# Patient Record
Sex: Female | Born: 1980 | Race: Asian | Hispanic: No | Marital: Single | Smoking: Never smoker
Health system: Southern US, Community
[De-identification: ages and names within clinical notes are randomized; demographics above are authoritative.]

## PROBLEM LIST (undated history)

## (undated) DIAGNOSIS — E119 Type 2 diabetes mellitus without complications: Secondary | ICD-10-CM

---
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
## 1999-02-27 ENCOUNTER — Other Ambulatory Visit: Admission: RE | Admit: 1999-02-27 | Discharge: 1999-02-27 | Payer: Self-pay | Admitting: Family Medicine

## 1999-04-18 ENCOUNTER — Ambulatory Visit (HOSPITAL_COMMUNITY): Admission: RE | Admit: 1999-04-18 | Discharge: 1999-04-18 | Payer: Self-pay | Admitting: *Deleted

## 1999-04-20 ENCOUNTER — Ambulatory Visit (HOSPITAL_COMMUNITY): Admission: RE | Admit: 1999-04-20 | Discharge: 1999-04-20 | Payer: Self-pay | Admitting: *Deleted

## 2000-09-19 ENCOUNTER — Emergency Department (HOSPITAL_COMMUNITY): Admission: EM | Admit: 2000-09-19 | Discharge: 2000-09-19 | Payer: Self-pay | Admitting: Emergency Medicine

## 2001-06-22 ENCOUNTER — Other Ambulatory Visit: Admission: RE | Admit: 2001-06-22 | Discharge: 2001-06-22 | Payer: Self-pay | Admitting: Family Medicine

## 2003-01-17 ENCOUNTER — Encounter: Payer: Self-pay | Admitting: *Deleted

## 2003-01-17 ENCOUNTER — Emergency Department (HOSPITAL_COMMUNITY): Admission: EM | Admit: 2003-01-17 | Discharge: 2003-01-17 | Payer: Self-pay | Admitting: *Deleted

## 2003-01-17 ENCOUNTER — Encounter: Payer: Self-pay | Admitting: Emergency Medicine

## 2003-05-20 ENCOUNTER — Other Ambulatory Visit: Admission: RE | Admit: 2003-05-20 | Discharge: 2003-05-20 | Payer: Self-pay | Admitting: Obstetrics and Gynecology

## 2003-12-14 ENCOUNTER — Inpatient Hospital Stay (HOSPITAL_COMMUNITY): Admission: AD | Admit: 2003-12-14 | Discharge: 2003-12-14 | Payer: Self-pay | Admitting: Obstetrics and Gynecology

## 2003-12-19 ENCOUNTER — Inpatient Hospital Stay (HOSPITAL_COMMUNITY): Admission: AD | Admit: 2003-12-19 | Discharge: 2003-12-22 | Payer: Self-pay | Admitting: Obstetrics & Gynecology

## 2004-05-23 ENCOUNTER — Other Ambulatory Visit: Admission: RE | Admit: 2004-05-23 | Discharge: 2004-05-23 | Payer: Self-pay | Admitting: Family Medicine

## 2004-12-26 ENCOUNTER — Encounter: Admission: RE | Admit: 2004-12-26 | Discharge: 2004-12-26 | Payer: Self-pay | Admitting: Family Medicine

## 2005-06-27 ENCOUNTER — Other Ambulatory Visit: Admission: RE | Admit: 2005-06-27 | Discharge: 2005-06-27 | Payer: Self-pay | Admitting: Family Medicine

## 2005-12-30 ENCOUNTER — Ambulatory Visit: Payer: Self-pay | Admitting: Family Medicine

## 2006-03-04 ENCOUNTER — Ambulatory Visit: Payer: Self-pay | Admitting: Family Medicine

## 2006-04-24 IMAGING — CT CT HEAD W/O CM
1 series · 16 of 28 positions shown, 20 images · IV contrast (agent unspecified)
Comparison: none

CLINICAL DATA: Headaches. 
 CT HEAD W/O CONTRAST: 
 Axial scans from the base of the vertex were performed in the unenhanced state.  This scan is compared to a dictation from the CT brain scan performed at [REDACTED] on 01/17/03 showing no abnormality.  On the present study the ventricular system is normal in size and configuration and the septum is midline in position.  No acute intracranial abnormality is seen.  No mass effect is noted.  On bone window images there is some opacification of a few ethmoid air cells.  No acute bony abnormality is seen.

[Series 2: brain · axial · 0.49mm/px · z∈[+11,+137]mm · 16 of 28 slices shown, 20 images]
[im 2/28  brain]
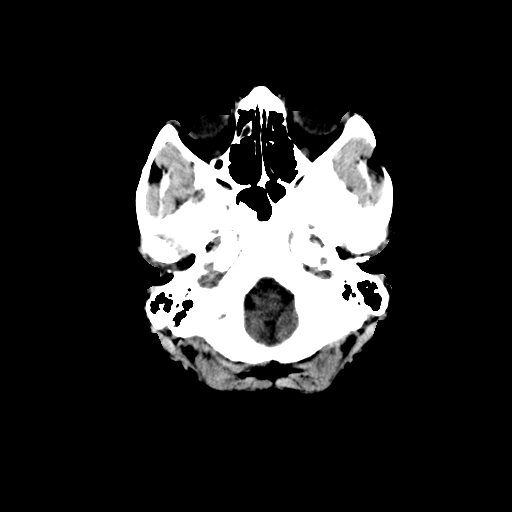
[im 2/28  bone]
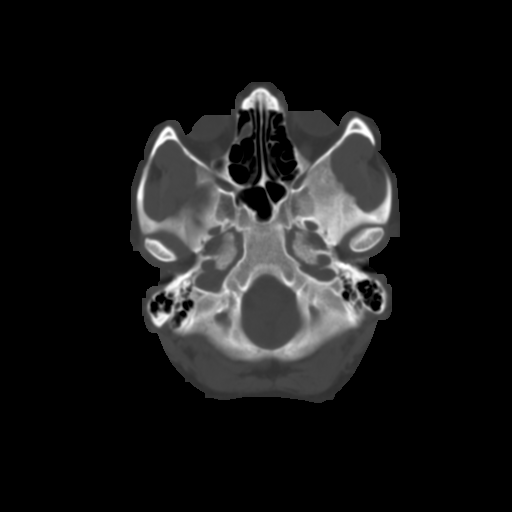
[im 4/28  brain]
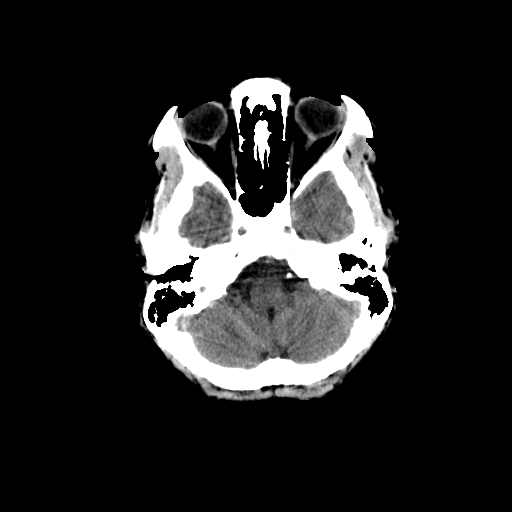
[im 6/28  brain]
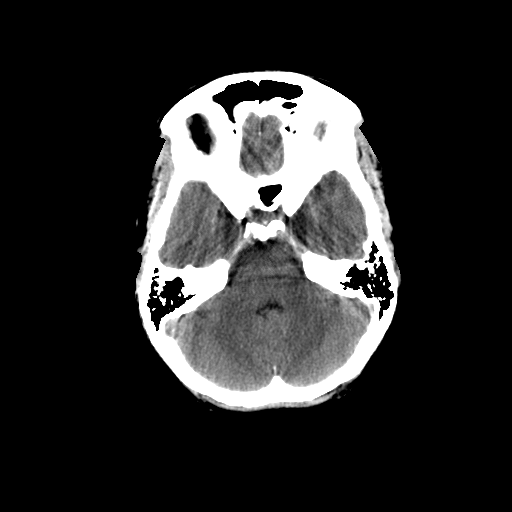
[im 7/28  brain]
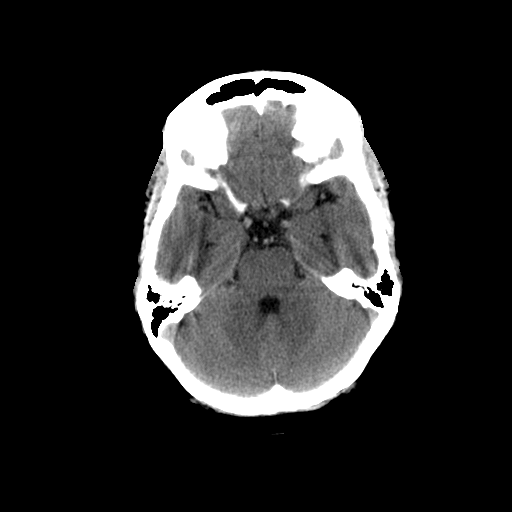
[im 9/28  brain]
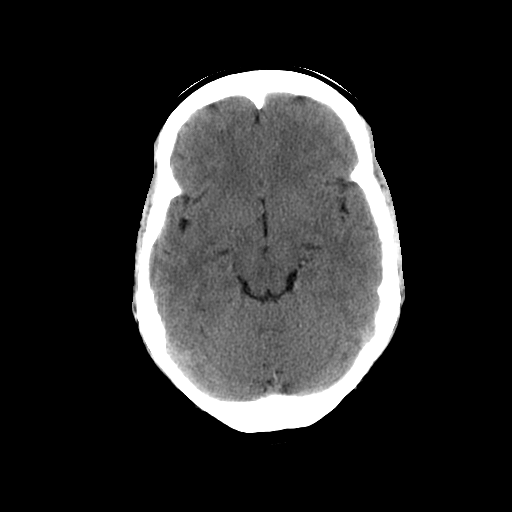
[im 9/28  bone]
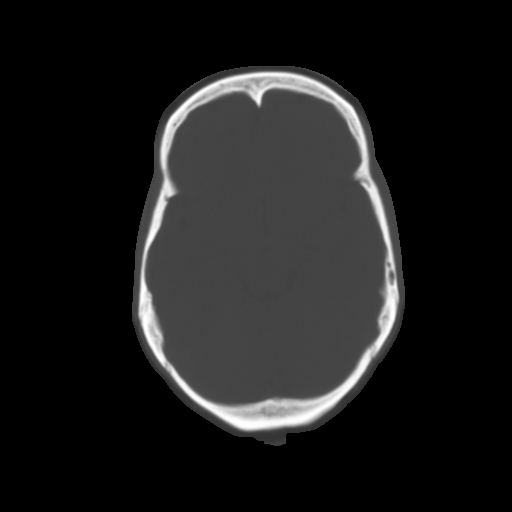
[im 10/28  brain]
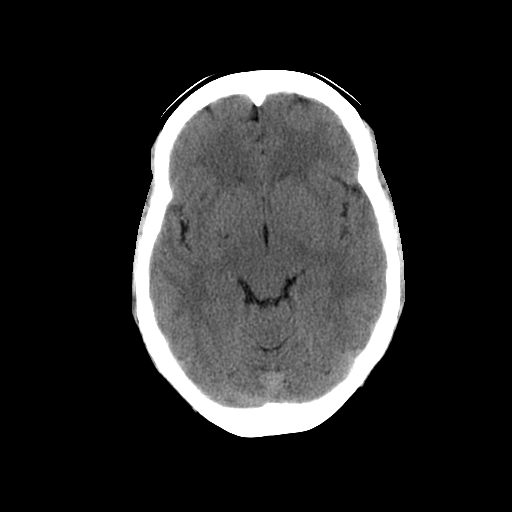
[im 12/28  brain]
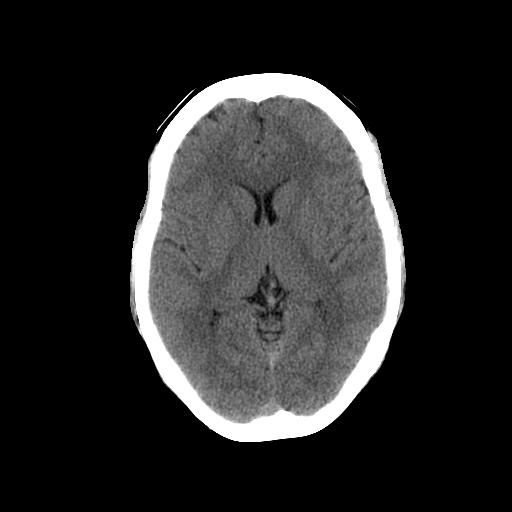
[im 14/28  brain]
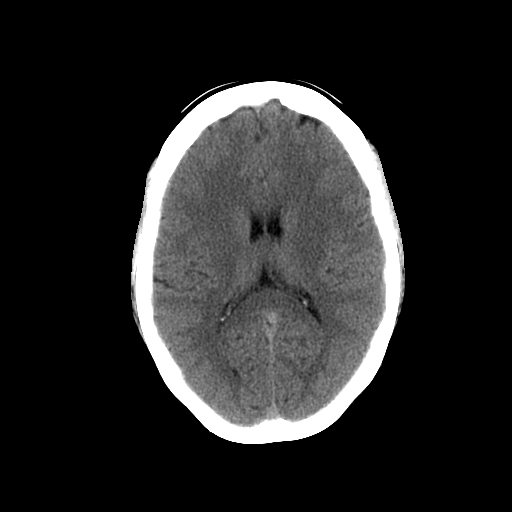
[im 15/28  brain]
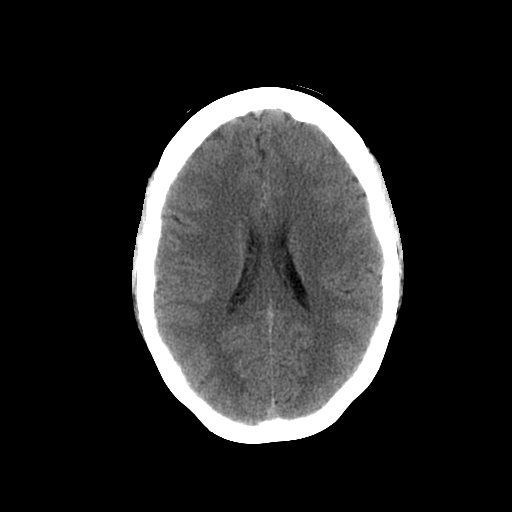
[im 15/28  bone]
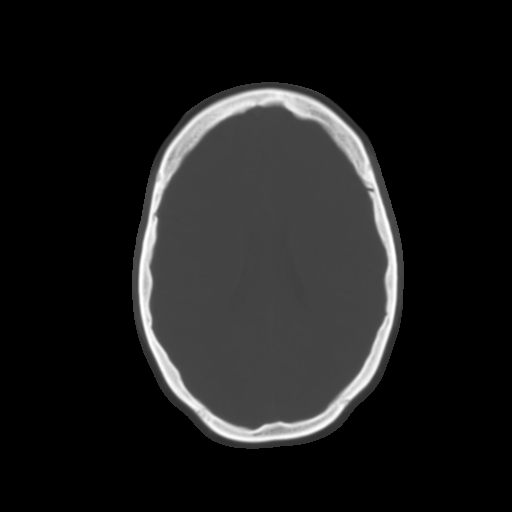
[im 17/28  brain]
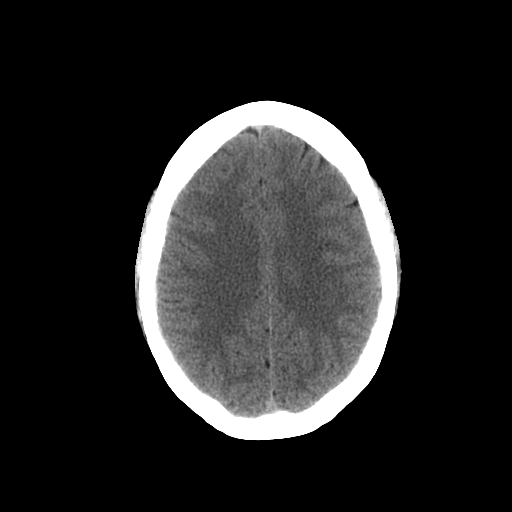
[im 19/28  brain]
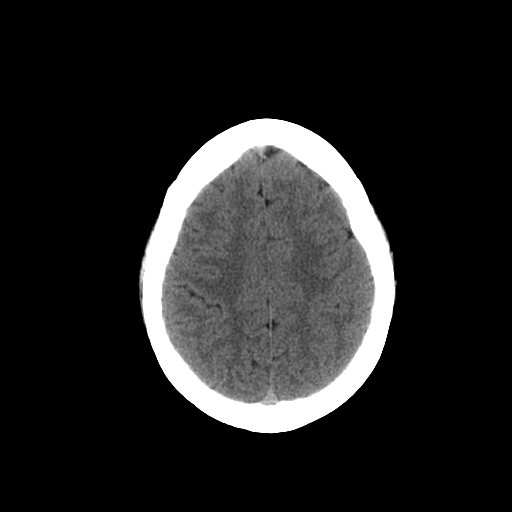
[im 20/28  brain]
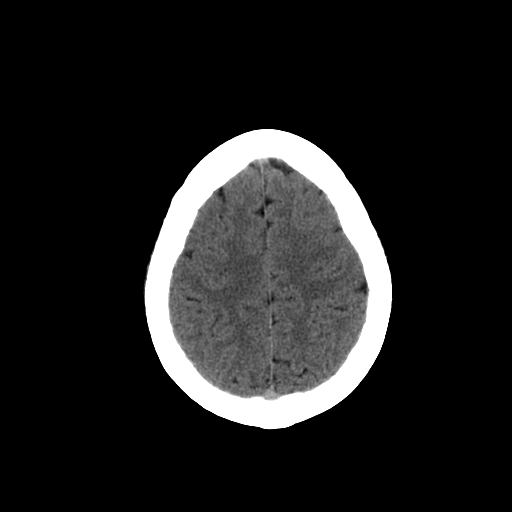
[im 22/28  brain]
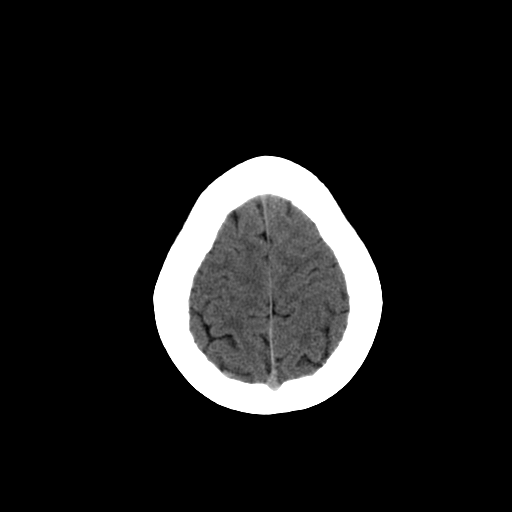
[im 22/28  bone]
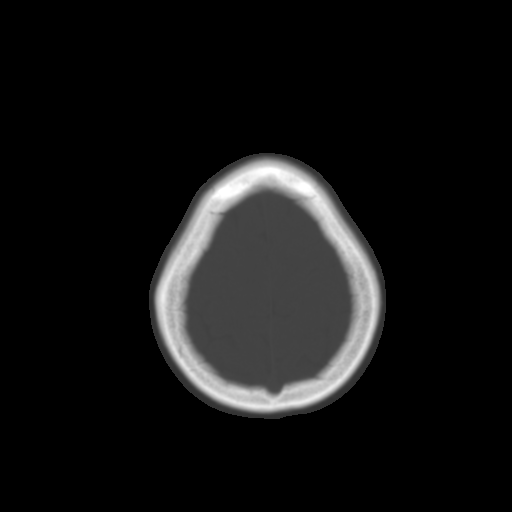
[im 23/28  brain]
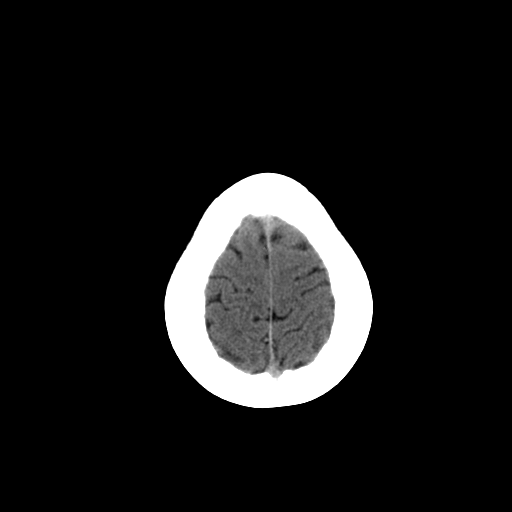
[im 25/28  brain]
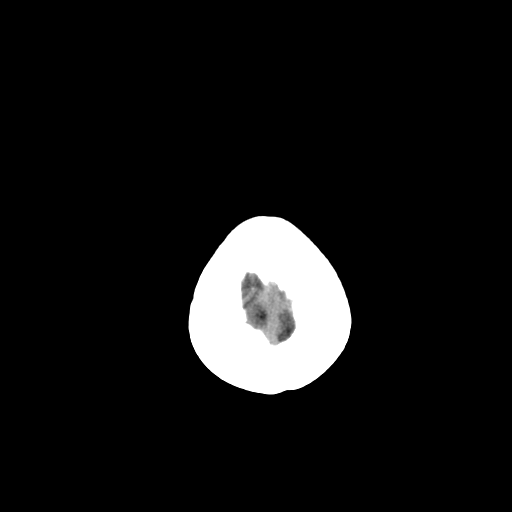
[im 27/28  brain]
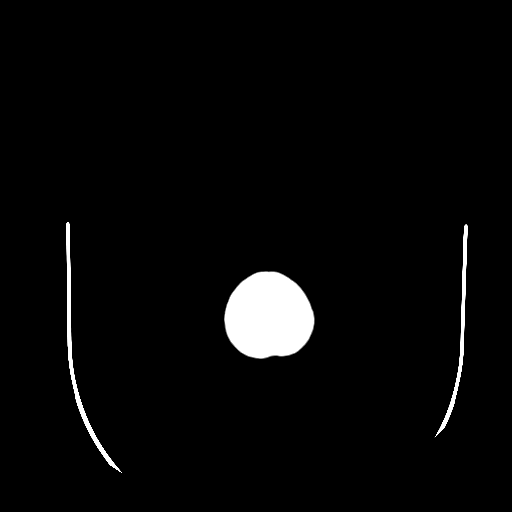

[16 of 28 positions shown; findings below may reference images not displayed]

IMPRESSION: 1.  No acute intracranial abnormality.  
 2.  There is some opacification of a few ethmoid air cells.

## 2006-05-26 ENCOUNTER — Ambulatory Visit: Payer: Self-pay | Admitting: Family Medicine

## 2006-07-04 ENCOUNTER — Ambulatory Visit: Payer: Self-pay | Admitting: Family Medicine

## 2006-08-07 ENCOUNTER — Inpatient Hospital Stay (HOSPITAL_COMMUNITY): Admission: EM | Admit: 2006-08-07 | Discharge: 2006-08-13 | Payer: Self-pay | Admitting: Emergency Medicine

## 2006-08-12 ENCOUNTER — Encounter (INDEPENDENT_AMBULATORY_CARE_PROVIDER_SITE_OTHER): Payer: Self-pay | Admitting: *Deleted

## 2006-08-18 ENCOUNTER — Ambulatory Visit: Payer: Self-pay | Admitting: Family Medicine

## 2006-08-29 ENCOUNTER — Ambulatory Visit: Payer: Self-pay | Admitting: Family Medicine

## 2006-09-05 ENCOUNTER — Ambulatory Visit: Payer: Self-pay | Admitting: Family Medicine

## 2006-09-17 ENCOUNTER — Ambulatory Visit: Payer: Self-pay | Admitting: Family Medicine

## 2006-09-19 ENCOUNTER — Ambulatory Visit (HOSPITAL_COMMUNITY): Admission: RE | Admit: 2006-09-19 | Discharge: 2006-09-19 | Payer: Self-pay | Admitting: Family Medicine

## 2006-10-15 ENCOUNTER — Ambulatory Visit: Payer: Self-pay | Admitting: Family Medicine

## 2006-11-14 ENCOUNTER — Other Ambulatory Visit: Admission: RE | Admit: 2006-11-14 | Discharge: 2006-11-14 | Payer: Self-pay | Admitting: Family Medicine

## 2007-02-25 ENCOUNTER — Emergency Department (HOSPITAL_COMMUNITY): Admission: EM | Admit: 2007-02-25 | Discharge: 2007-02-25 | Payer: Self-pay | Admitting: Emergency Medicine

## 2007-12-09 IMAGING — CR DG CHEST 1V PORT
1 series · 1 of 1 positions shown · non-contrast
Comparison: none

CLINICAL DATA: Pneumonia.  Status-post lung biopsy. Known left upper lobe mass. 
 PORTABLE CHEST - 1 VIEW ? 08/12/06 AT 7475 HOURS:

[view not recorded]
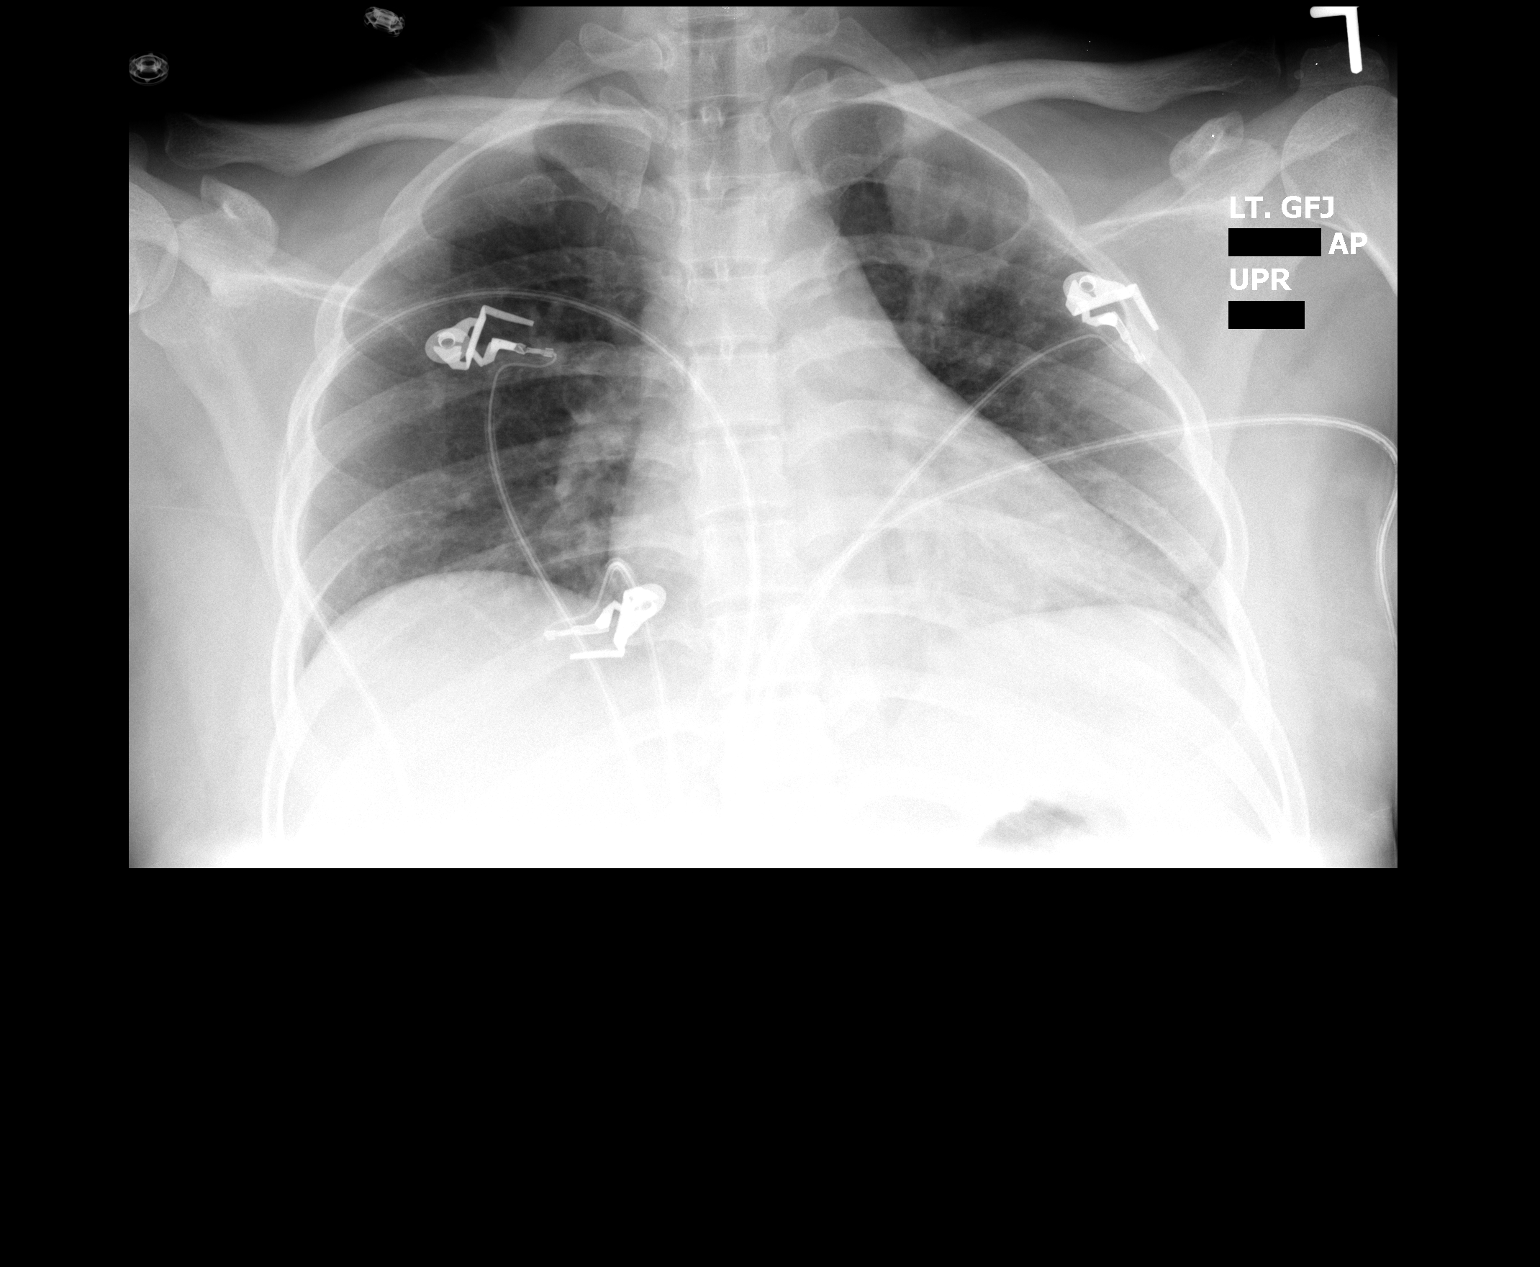

[1 of 1 positions shown; findings below may reference images not displayed]

FINDINGS: Left upper lobe cavitary mass is appreciated. Specifically, no evidence of a pneumothorax post-reported biopsy.
IMPRESSION: Left upper lobe mass. No pneumothorax or other acute chest findings.

## 2008-01-16 IMAGING — CR DG CHEST 2V
2 series · 2 of 2 positions shown · non-contrast
Comparison: none

HISTORY: Pneumonia, cough, followup

CHEST 2 VIEWS:
Comparison 08/12/2006
Upper normal heart size.
Normal mediastinal contours and pulmonary vascularity.
Decreased density in left upper lobe since previous exam question improving
infiltrate.
Remaining lungs clear.
No pleural effusion or pneumothorax.

[view not recorded (1 of 2)]
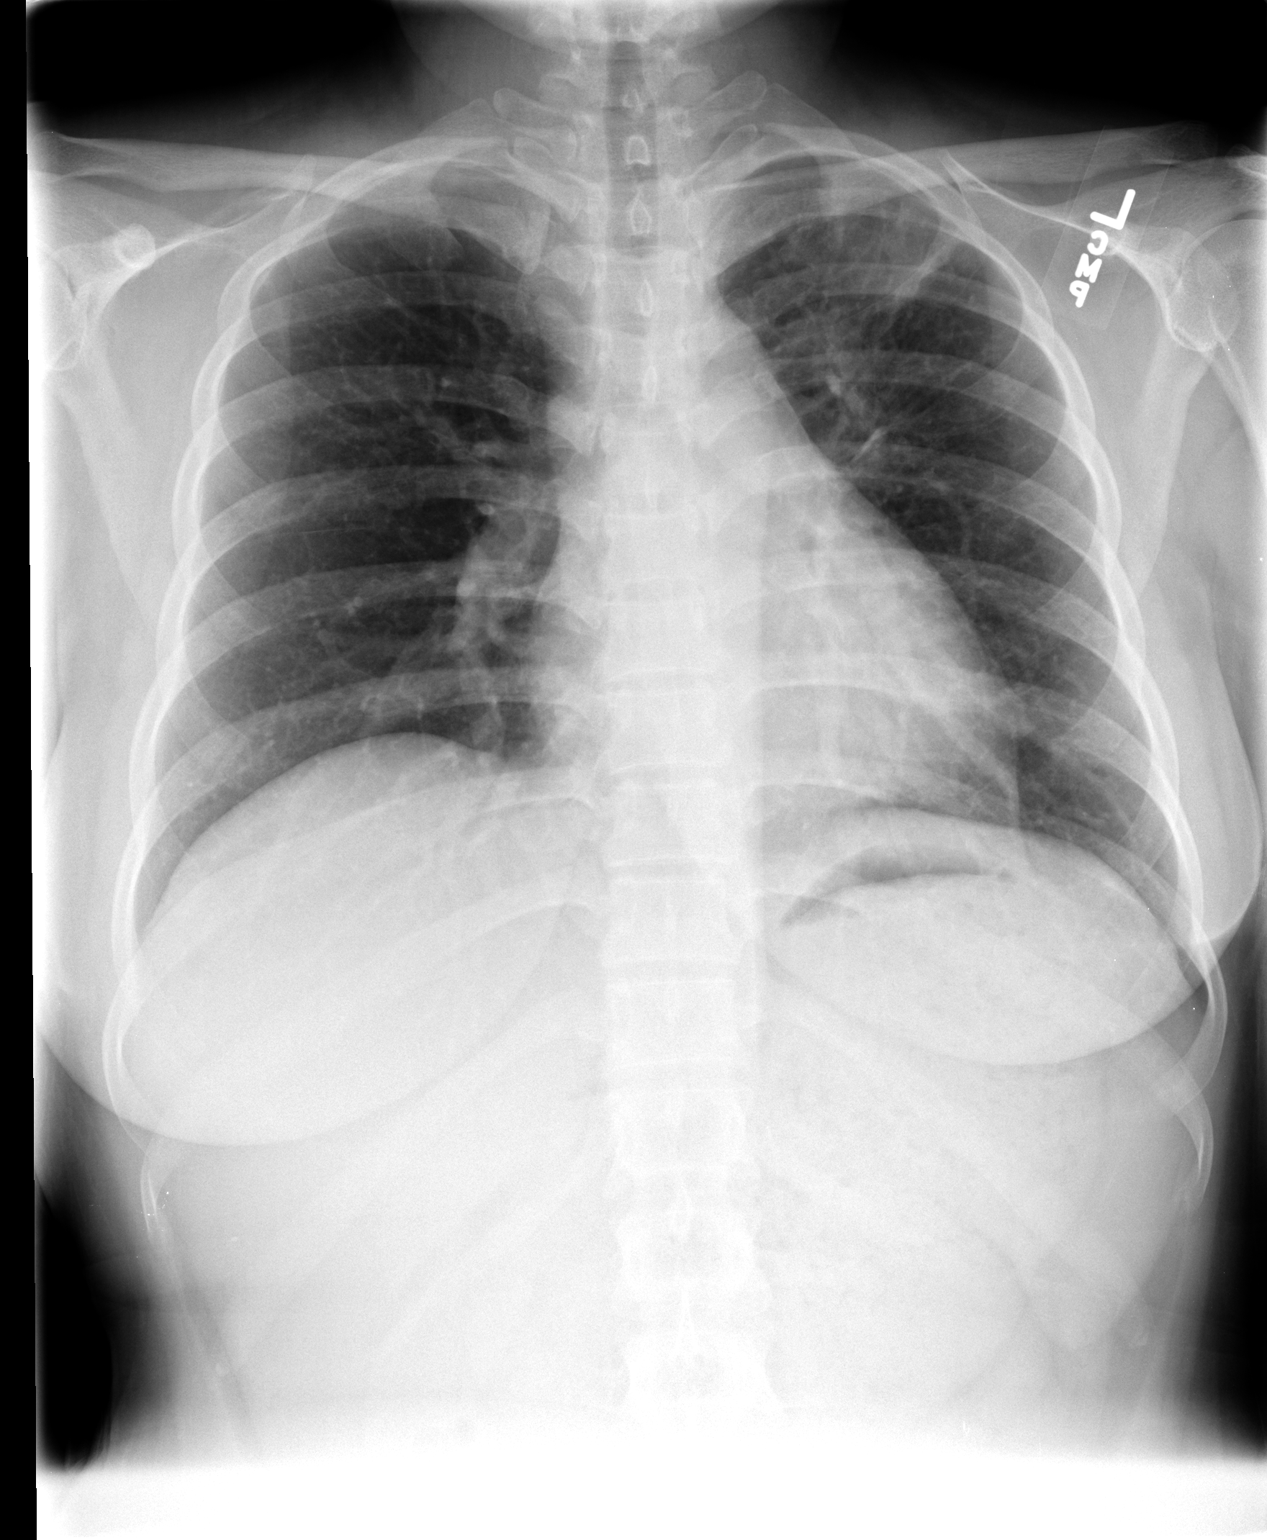

[view not recorded (2 of 2)]
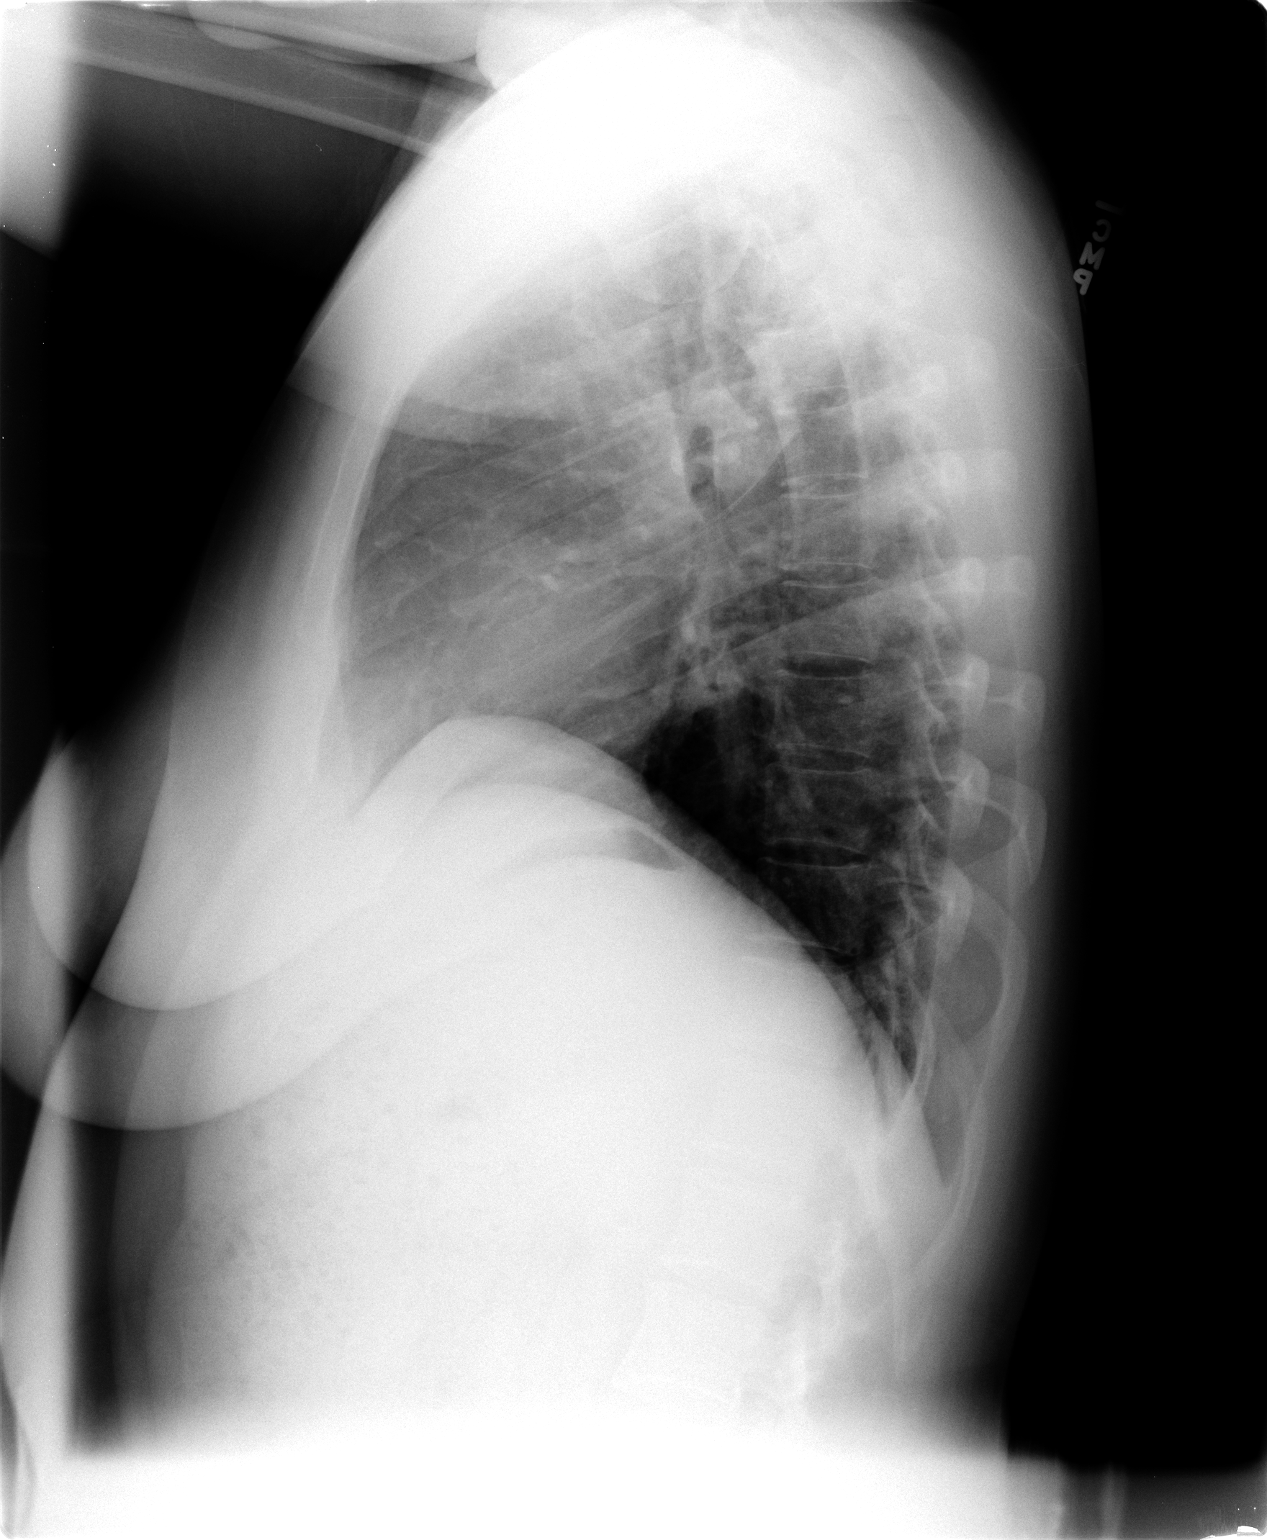

[2 of 2 positions shown; findings below may reference images not displayed]

IMPRESSION: Improving density in left upper lobe suggesting that the area of masslike
opacity seen on prior CT chest was infiltrate rather than tumor.
Continued followup until complete resolution recommended to exclude underlying
pulmonary mass.
Remainder of exam unremarkable and unchanged.

## 2008-06-21 ENCOUNTER — Other Ambulatory Visit: Admission: RE | Admit: 2008-06-21 | Discharge: 2008-06-21 | Payer: Self-pay | Admitting: Family Medicine

## 2008-12-28 ENCOUNTER — Encounter (INDEPENDENT_AMBULATORY_CARE_PROVIDER_SITE_OTHER): Payer: Self-pay | Admitting: Family Medicine

## 2009-01-05 ENCOUNTER — Emergency Department (HOSPITAL_COMMUNITY): Admission: EM | Admit: 2009-01-05 | Discharge: 2009-01-05 | Payer: Self-pay | Admitting: Emergency Medicine

## 2009-12-15 LAB — CONVERTED CEMR LAB

## 2010-07-12 ENCOUNTER — Ambulatory Visit: Payer: Self-pay | Admitting: Nurse Practitioner

## 2010-07-12 DIAGNOSIS — R5383 Other fatigue: Secondary | ICD-10-CM

## 2010-07-12 DIAGNOSIS — E119 Type 2 diabetes mellitus without complications: Secondary | ICD-10-CM | POA: Insufficient documentation

## 2010-07-12 DIAGNOSIS — R5381 Other malaise: Secondary | ICD-10-CM | POA: Insufficient documentation

## 2010-07-13 ENCOUNTER — Ambulatory Visit: Payer: Self-pay | Admitting: Nurse Practitioner

## 2010-07-13 DIAGNOSIS — Z862 Personal history of diseases of the blood and blood-forming organs and certain disorders involving the immune mechanism: Secondary | ICD-10-CM | POA: Insufficient documentation

## 2010-07-13 DIAGNOSIS — Z8639 Personal history of other endocrine, nutritional and metabolic disease: Secondary | ICD-10-CM | POA: Insufficient documentation

## 2010-07-13 LAB — CONVERTED CEMR LAB
BUN: 12 mg/dL (ref 6–23)
CO2: 27 meq/L (ref 19–32)
Creatinine, Ser: 0.55 mg/dL (ref 0.40–1.20)
Eosinophils Relative: 3 % (ref 0–5)
Glucose, Bld: 243 mg/dL — ABNORMAL HIGH (ref 70–99)
HCT: 41.4 % (ref 36.0–46.0)
Hemoglobin: 14.1 g/dL (ref 12.0–15.0)
Hgb A1c MFr Bld: 9.3 % — ABNORMAL HIGH (ref <5.7)
Lymphocytes Relative: 44 % (ref 12–46)
Lymphs Abs: 5.3 10*3/uL — ABNORMAL HIGH (ref 0.7–4.0)
Monocytes Absolute: 0.5 10*3/uL (ref 0.1–1.0)
Preg, Serum: NEGATIVE
Total Bilirubin: 0.4 mg/dL (ref 0.3–1.2)
WBC: 12.2 10*3/uL — ABNORMAL HIGH (ref 4.0–10.5)

## 2010-07-16 ENCOUNTER — Telehealth (INDEPENDENT_AMBULATORY_CARE_PROVIDER_SITE_OTHER): Payer: Self-pay | Admitting: Nurse Practitioner

## 2010-07-30 ENCOUNTER — Encounter (INDEPENDENT_AMBULATORY_CARE_PROVIDER_SITE_OTHER): Payer: Self-pay | Admitting: Nurse Practitioner

## 2010-08-23 ENCOUNTER — Ambulatory Visit: Payer: Self-pay | Admitting: Nurse Practitioner

## 2010-10-04 ENCOUNTER — Ambulatory Visit
Admission: RE | Admit: 2010-10-04 | Discharge: 2010-10-04 | Payer: Self-pay | Source: Home / Self Care | Attending: Nurse Practitioner | Admitting: Nurse Practitioner

## 2010-10-14 LAB — CONVERTED CEMR LAB
Bilirubin Urine: NEGATIVE
Hep A IgM: NEGATIVE
Hep B C IgM: NEGATIVE
Ketones, urine, test strip: NEGATIVE
Microalb, Ur: 28.36 mg/dL — ABNORMAL HIGH (ref 0.00–1.89)
Nitrite: NEGATIVE
Specific Gravity, Urine: >=1.03

## 2010-10-16 NOTE — Progress Notes (Signed)
Summary: Lab results  Phone Note Outgoing Call   Summary of Call: notify pt that her liver enzymes are elevated she is diabetic so she needs to be sure that her blood sugar is under good control Does she drink any alcohol?  If so she needs to decrease her use as it is making her liver work too hard she also may have a fatty liver which is worse by fried and fatty foods. She needs lab visit - lft's in 6-8 weeks if still elevated then may need additional testing such as u/s Initial call taken by: Lehman Prom FNP,  July 16, 2010 3:06 PM  Follow-up for Phone Call        [phone] no answer, no voice mail; [phone] due to network difficulties call can not be completed. Gaylyn Cheers RN  July 25, 2010 3:17 PM  pt informed and will come for a visit on 08/23/10 with provider.  Follow-up by: Levon Hedger,  July 27, 2010 9:04 AM

## 2010-10-16 NOTE — Assessment & Plan Note (Signed)
Summary: Re-establish Care   Vital Signs:  Patient profile:   30 year old female LMP:     06/2010 Height:      61 inches Weight:      144.5 pounds BMI:     27.40 BSA:     1.65 Temp:     97.8 degrees F oral Pulse rate:   72 / minute Pulse rhythm:   regular Resp:     16 per minute BP sitting:   122 / 86  (left arm) Cuff size:   regular  Vitals Entered By: Levon Hedger (July 12, 2010 4:14 PM)  Nutrition Counseling: Patient's BMI is greater than 25 and therefore counseled on weight management options. CC:  re-establish...DM..Marland Kitchenwants to get depo shot Is Patient Diabetic? Yes Pain Assessment Patient in pain? no      CBG Result 285 CBG Device ID B  Does patient need assistance? Functional Status Self care Ambulation Normal Comments pt has had her menses 10/1 thur 10/27 LMP (date): 06/2010     Enter LMP: 06/2010 Last PAP Result done in another office   CC:   re-establish...DM..Marland Kitchenwants to get depo shot.  History of Present Illness:  Pt into the office for family planning. Pt was previously on depoprovera and tolerated well however due to finances she had to stop taking the medications.  she transitioned to [facility] Pt missed her last depoprovera injection due tin July. Her menses restarted in October 1st and pt states that she is still flowing.  Pt is not married but lives her boyfriend. She has 2 children - ages 58 and 6  Pap - done April of this year by another provider. Pt reports that it was ok.  Diabetes Management History:      The patient is a 30 years old female who comes in for evaluation of DM Type 2.  She has not been enrolled in the "Diabetic Education Program".  She states lack of understanding of dietary principles and is not following her diet appropriately.  No sensory loss is reported.  Self foot exams are not being performed.  She is not checking home blood sugars.  She says that she is exercising.        Hypoglycemic symptoms are  not occurring.  No hyperglycemic symptoms are reported.  Other comments include: pt is taking diabetes medication but she does not know the name of the medication.    Habits & Providers  Alcohol-Tobacco-Diet     Alcohol drinks/day: 0     Tobacco Status: never  Exercise-Depression-Behavior     Does Patient Exercise: yes     Exercise Counseling: to improve exercise regimen     Drug Use: never  Allergies (verified): No Known Drug Allergies  Social History: Smoking Status:  never Drug Use:  never Does Patient Exercise:  yes  Review of Systems General:  Complains of fatigue. CV:  Denies chest pain or discomfort. Resp:  Denies cough. GI:  Denies abdominal pain, nausea, and vomiting.  Physical Exam  General:  alert.   Head:  normocephalic.   Lungs:  normal breath sounds.   Heart:  normal rate and regular rhythm.   Abdomen:  normal bowel sounds.   Neurologic:  alert & oriented X3.   Psych:  Oriented X3.    Diabetes Management Exam:    Foot Exam (with socks and/or shoes not present):       Sensory-Monofilament:          Left foot: normal  Right foot: normal       Nails:          Left foot: normal          Right foot: normal   Impression & Recommendations:  Problem # 1:  FAMILY PLANNING (ICD-V25.09) will check labs today pt to return tomorrow for infection (assuming lab is negative) PAP done per pt at previous office need to get records from previous provider Orders: T-Pregnancy (Serum), Qual.  (98119-14782)  Problem # 2:  FATIGUE (ICD-780.79) will check cbc today may be due to menses Orders: T-CBC w/Diff (95621-30865)  Problem # 3:  NEED PROPHYLACTIC VACCINATION&INOCULATION FLU (ICD-V04.81) given today in office  Problem # 4:  DIABETES MELLITUS, TYPE II (ICD-250.00)  Orders: Capillary Blood Glucose/CBG (78469) T-Comprehensive Metabolic Panel (62952-84132) T- Hemoglobin A1C (44010-27253)  Complete Medication List: 1)  Depo-provera 150 Mg/ml Susp  (Medroxyprogesterone acetate) .... One injection im x 1 dose for birth control  Other Orders: Flu Vaccine 67yrs + (66440) Admin 1st Vaccine (34742)  Diabetes Management Assessment/Plan:      Her blood pressure goal is < 130/80.    Patient Instructions: 1)  Diabetes - your blood sugar is high today.  Labs will be done to check how well controlled your diabetes is. 2)  Bring all your medications into this office tomorrow. 3)  Go get depoprovera medication from the pharmacy 4)  Bring back to this office for infection 5)  schedule a nurse visit tomorrow - check lab before given injection 6)  review other labs done today. 7)  if negative then ok to give depoprovera 8)  You have received the flu vaccine today 9)  Schedule an appointment with n.martin,fnp for diabetes Prescriptions: DEPO-PROVERA 150 MG/ML SUSP (MEDROXYPROGESTERONE ACETATE) One injection IM x 1 dose for birth control  #1 x 3   Entered and Authorized by:   Lehman Prom FNP   Signed by:   Lehman Prom FNP on 07/12/2010   Method used:   Print then Give to Patient   RxID:   [id number]    Orders Added: 1)  Flu Vaccine 77yrs + [88416] 2)  Admin 1st Vaccine [90471] 3)  Est. Patient Level III [60630] 4)  T-Pregnancy (Serum), Qual.  [84703-23895] 5)  T-CBC w/Diff [16010-93235] 6)  Capillary Blood Glucose/CBG [82948] 7)  T-Comprehensive Metabolic Panel [80053-22900] 8)  T- Hemoglobin A1C [83036-23375]   Immunizations Administered:  Influenza Vaccine # 1:    Vaccine Type: Fluvax 3+    Site: left deltoid    Mfr: [email or web address]    Dose: 0.5 ml    Route: IM    Given by: Levon Hedger    Exp. Date: 03/16/2011    Lot #: [id number]    VIS given: 04/10/10 version given July 12, 2010.  Flu Vaccine Consent Questions:    Do you have a history of severe allergic reactions to this vaccine? no    Any prior history of allergic reactions to egg and/or gelatin? no    Do you have a sensitivity to the preservative  Thimersol? no    Do you have a past history of Guillan-Barre Syndrome? no    Do you currently have an acute febrile illness? no    Have you ever had a severe reaction to latex? no    Vaccine information given and explained to patient? yes    Are you currently pregnant? no    ndc  [phone]  Immunizations Administered:  Influenza Vaccine # 1:    Vaccine Type:  Fluvax 3+    Site: left deltoid    Mfr: [email or web address]    Dose: 0.5 ml    Route: IM    Given by: Levon Hedger    Exp. Date: 03/16/2011    Lot #: [id number]    VIS given: 04/10/10 version given July 12, 2010.           Diabetic Foot Exam    10-g (5.07) Semmes-Weinstein Monofilament Test Performed by: Levon Hedger          Right Foot          Left Foot Visual Inspection               Test Control      normal         normal Site 1         normal         normal Site 2         normal         normal Site 3         normal         normal Site 4         normal         normal Site 5         normal         normal Site 6         normal         normal Site 7         normal         normal Site 8         normal         normal Site 9         normal         normal Site 10         normal         normal  Impression      normal         normal

## 2010-10-16 NOTE — Assessment & Plan Note (Addendum)
Summary: Depoprovera  Nurse Visit Jan. 19 is next depo.... [name]  July 13, 2010 9:07 AM   Allergies: No Known Drug Allergies Laboratory Results   Urine Tests    Routine Urinalysis   Glucose: negative   (Normal Range: Negative) Bilirubin: negative   (Normal Range: Negative) Ketone: negative   (Normal Range: Negative) Spec. Gravity: >=1.030   (Normal Range: 1.003-1.035) Blood: negative   (Normal Range: Negative) pH: 5.5   (Normal Range: 5.0-8.0) Protein: 100   (Normal Range: Negative) Urobilinogen: 0.2   (Normal Range: 0-1) Nitrite: negative   (Normal Range: Negative) Leukocyte Esterace: negative   (Normal Range: Negative)       Medication Administration  Injection # 1:    Medication: Depo-Provera 150mg     Diagnosis: FAMILY PLANNING (ICD-V25.09)    Route: IM    Site: R deltoid    Exp Date: 11/2012    Lot #: [id number]    Mfr: [email or web address]    Comments: ndc    Patient tolerated injection without complications    Given by: [name] (July 13, 2010 9:02 AM)  Orders Added: 1)  Est. Patient Level I [03474] 2)  T-Urine Microalbumin w/creat. ratio [82043-[address]] 3)  Depo-Provera 150mg  [J1055]  Patient Instructions: 1)  Schedule appointment with n.martin,fnp  in 4 weeks for diabetes. 2)  Will need foot check, u/a, cbg, lipids. 3)  Come fasting for this appointment 4)  Bring all your medications into the office during this visit 5)  Next depoprovera will be due on January 19th 6)  Bring medication (injection) into the office on this day. 7)  Schedule as office visit with n.martin,fnp to also discuss diabetes   Medication Administration  Injection # 1:    Medication: Depo-Provera 150mg     Diagnosis: FAMILY PLANNING (ICD-V25.09)    Route: IM    Site: R deltoid    Exp Date: 11/2012    Lot #: [id number]    Mfr: [email or web address]    Comments: ndc    Patient tolerated injection without complications    Given by: [name] (July 13, 2010 9:02  AM)  Orders Added: 1)  Est. Patient Level I [87564] 2)  T-Urine Microalbumin w/creat. ratio [82043-[address]] 3)  Depo-Provera 150mg  [J1055]

## 2010-10-25 ENCOUNTER — Encounter (INDEPENDENT_AMBULATORY_CARE_PROVIDER_SITE_OTHER): Payer: Self-pay | Admitting: Nurse Practitioner

## 2010-10-25 DIAGNOSIS — R7611 Nonspecific reaction to tuberculin skin test without active tuberculosis: Secondary | ICD-10-CM | POA: Insufficient documentation

## 2010-11-01 NOTE — Letter (Signed)
Summary: RECEIVED RECORDS FROM [facility]  RECEIVED RECORDS FROM [facility]   Imported By: Arta Bruce 10/25/2010 10:15:24  _____________________________________________________________________  External Attachment:    Type:   Image     Comment:   External Document

## 2010-11-01 NOTE — Miscellaneous (Signed)
Summary: Hx update  Clinical Lists Changes  Problems: Added new problem of NONSPEC REACT TUBERCULIN SKIN TEST W/O ACTIVE TB (ICD-795.51) - Tx INH

## 2011-02-01 NOTE — H&P (Signed)
[name], [name]                              ACCOUNT NO.:  [id number]   MEDICAL RECORD NO.:  [id number]                   PATIENT TYPE:  MAT   LOCATION:  [facility]                                 FACILITY:  [facility]   PHYSICIAN:  Janine Limbo, M.D.            DATE OF BIRTH:  [birth year]   DATE OF ADMISSION:  12/19/2003  DATE OF DISCHARGE:                                HISTORY & PHYSICAL   [name] is a 30 year old gravida 2, para 1-0-0-1, at 40-5/7 weeks, who  presents with a small amount of leaking and a small amount of spotting since  approximately 7:30 a.m.  She reports mild irregular uterine contractions.  She does report positive fetal movement and denies any other problems.  Pregnancy has been remarkable for:   1. Language barrier with patient being [place] although she does speak     some [place] and understand [place].  2. 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    PRENATAL LABORATORY DATA:  Blood type is A positive.  Rh antibody negative.  VDRL nonreactive.  Rubella titer positive.  Hepatitis B surface antigen  negative.  HIV nonreactive. GC and Chlamydia cultures were negative.  Pap  was normal.  Glucose challenge was normal.  Hemoglobin electrophoresis was  normal.  Hemoglobin upon entering the practice was 12.3.  It was 12 at 25  weeks.  Group B strep culture was negative at 36 weeks.  Other cultures were  also negative at 36 weeks.  EDC of December 15, 2003, was established by last  menstrual period and was in agreement with ultrasound at approximately 18  weeks.   HISTORY OF PRESENT PREGNANCY:  The patient entered care at approximately 10  weeks.  She had urine culture secondary to proteinuria.  Urine culture was  negative.  She had a hemoglobin electrophoresis and HIV with __________  labs.  These were all negative.  She had an ultrasound at 18 weeks that  showed normal growth and development.  Her AFP was normal.  She did have  some sciatica beginning at 22 weeks.   She also began to have some  varicosities on the right upper thigh at approximately 26 weeks.  Her  Glucola was normal.  She continued to have those issues with varicosities in  the upper thigh and lower leg.  She stopped work at approximately 34-35  weeks secondary to the varicosities since she worked in a very cold  environment.  The rest of her pregnancy was essentially uncomplicated.  She  was 1 cm at her last exam.   PAST OBSTETRICAL HISTORY:  In 1999 she had a vaginal birth of a female infant,  weight 7 pounds 10 ounces, at 40 weeks.  She was in labor 48 hours.  She had  no anesthesia.  She did have some hyperemesis with that pregnancy.   PAST MEDICAL HISTORY:  She stopped birth control in October 2003.  She has no known medication allergies.   FAMILY HISTORY:  Her mother has hypertension.  Her sister has heart disease.  Her mother has diabetes.   GENETIC HISTORY:  Unremarkable.   SOCIAL HISTORY:  The patient is single.  The father of the baby has been  somewhat involved.  His name is [employer].  The patient is [place]  She  has a tenth grade education, and she works at [employer].  Her partner  has an unknown amount of education, and he is employed at [employer].  She has  been followed by the certified nurse midwife service at [facility].  She denies any alcohol, drug, or tobacco use during this pregnancy.   PHYSICAL EXAMINATION:  VITAL SIGNS:  Stable.  The patient is afebrile.  HEENT:  Within normal limits.  CHEST:  Bilateral breath sounds are clear.  CARDIAC:  Regular rate and rhythm without murmur.  BREASTS:  Soft and nontender.  ABDOMEN:  Fundal height is approximately 38 cm.  Estimated fetal weight 7 to  7-1/2 pounds.  Uterine contractions every two to five minutes, mild to  moderate quality.  PELVIC:  Cervical exam 3 cm, 90%, vertex, at a -1 station with bulging bag  of water noted.  Cervix is very anterior.  MONITORING:  Fetal monitor is reactive with  no decelerations.  EXTREMITIES:  Deep tendon reflexes are 2+ without clonus.  There is a trace  edema noted.  There are scattered superficial varicosities in the right and  left upper thigh and lower extremities.   IMPRESSION:  1. Intrauterine pregnancy at 40-4/7 weeks.  2. Early labor.  3. Spontaneous rupture of membranes.   PLAN:  1. Admit to birthing suite per consult with Dr. Marline Backbone as attending     physician.  2. Routine certified nurse midwife orders.  3. Will plan observation at present secondary to patient wanting to wait for     any intervention until her sister arrives here after approximately 4:15.     [name], C.N.M.                   Janine Limbo, M.D.    VLL/MEDQ  D:  12/19/2003  T:  12/19/2003  Job:  [id number]

## 2011-02-01 NOTE — H&P (Signed)
[name], [name]                  ACCOUNT NO.:  [id number]   MEDICAL RECORD NO.:  [id number]          PATIENT TYPE:  EMS   LOCATION:  [facility]                         FACILITY:  [facility]   PHYSICIAN:  Corinna L. Lendell Caprice, MDDATE OF BIRTH:  05/29/1981   DATE OF ADMISSION:  08/07/2006  DATE OF DISCHARGE:                              HISTORY & PHYSICAL   CHIEF COMPLAINT:  Left back pain.   HPI:  [name] is an unassigned 30 year old [place] female who  usually goes to [facility] and presents to the emergency room with left-  sided upper back pain that started yesterday.  She told me that she has  not been having a cough, but according to the ED physician she has.  She  denies any fevers or chills.  She has lost about 12 pounds over the last  several months.  She has had night sweats.  She has had no shortness of  breath.  She has had polyuria and polydipsia.  She has had no  tuberculosis exposure.  She moved to the [place] 10 years ago.  She is able to speak some English and her sister is here who provides  much of the history.  She goes to [facility] for her primary care  needs.   PAST MEDICAL HISTORY:  Migraine.   MEDICATIONS:  None.   ALLERGIES:  None.   SOCIAL HISTORY:  The patient works at [employer].  She is here with her sister.  There is no smoking, drinking or drug  history.   FAMILY HISTORY:  Negative for tuberculosis or cancer.  Her mother has  diabetes type 2.   PAST SURGICAL HISTORY:  None.   REVIEW OF SYSTEMS:  As above, otherwise negative.   PHYSICAL EXAMINATION:  VITAL SIGNS:  Temperature 97.1, blood pressure  129/91, pulse 102, respiratory rate 18.  Oxygen saturation 97% on room  air.  GENERAL:  The patient is a well-developed, well-nourished Asian female  in no acute distress.  HEENT:  Normocephalic, atraumatic.  Pupils equal, round, and reactive to  light.  Sclerae are nonicteric.  She has moist mucous membranes.  Oropharynx is  without erythema or exudate.  NECK:  Supple.  No lymphadenopathy.  LUNGS:  Clear to auscultation bilaterally without wheezes, rhonchi, or  rales.  CARDIOVASCULAR:  Regular rate and rhythm without murmurs, gallops, or  rubs.  ABDOMEN:  Normal bowel sounds.  Soft, nontender, nondistended.  GU AND RECTAL:  Deferred.  EXTREMITIES:  No clubbing, cyanosis, or edema.  LYMPHATIC:  No lymphadenopathy.  SKIN:  She has excoriation marks and papules on her arms and legs.  No  rash otherwise.  PSYCHIATRIC:  Normal affect.  NEUROLOGIC:  Alert and oriented.  Cranial nerves and sensorimotor exam  are intact.   LABORATORY:  White blood cell count is 11,000 with a normal  differential, hemoglobin is 15.3, hematocrit 44.2, platelet count  normal.  D-dimer is 0.4.  Basic metabolic panel is significant for a  glucose of 302.  Point of care enzymes negative.  Chest x-ray showed a  1.5 cm mass-like  density at the left lung apex, likely representing  pneumonia.  CT of the chest shows a left upper lobe 4.6 x 4 x 3.9 cm  mass-like lesion with central lucency, raising the possibility of  cavitation.  Other differential diagnoses includes cavitary pneumonia,  inflammation nodule, or cavitary malignancy.   ASSESSMENT AND PLAN:  1. Cavitary pneumonia:  In this [place] patient with a history of      night sweats and weight loss, we will need to rule out      tuberculosis.  She will be placed in respiratory isolation.  She      reports that she has not had much of a cough to me and I will ask      respiratory to induce sputums for AFB x3.  I will also place a PPD.      She will get Zosyn for now.  2. New onset diabetic.  We will check a hemoglobin A1c, monitor blood      sugars.  I suspect she will be able to be managed on oral      medications which I will start.      Corinna L. Lendell Caprice, MD  Electronically Signed     CLS/MEDQ  D:  08/07/2006  T:  08/07/2006  Job:  [phone] [id number]

## 2011-02-01 NOTE — Discharge Summary (Signed)
[name], [name]                  ACCOUNT NO.:  [id number]   MEDICAL RECORD NO.:  [id number]           PATIENT TYPE:   LOCATION:                                 FACILITY:   PHYSICIAN:  Kela Millin, M.D.DATE OF BIRTH:  Aug 17, 1981   DATE OF ADMISSION:  08/07/2006  DATE OF DISCHARGE:  08/13/2006                               DISCHARGE SUMMARY   DISCHARGE DIAGNOSES:  1. 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  2. Positive PPD.  3. Diabetes mellitus, uncontrolled--new onset.   PROCEDURES AND STUDIES:  1. Biopsy of left upper lobe mass for interventional radiology showed      granulomatous inflammation, special stains for organisms negative.      No malignant cells identified.  2. CT scan of the chest--cavitary mass-like lesion in the apical      posterior segment of the left lobe.  No definite chest wall      invasion.  Cavitary pneumonia versus cavitary malignancy or      inflammatory nodule per radiology.   CONSULTATIONS:  Interventional radiology.   HISTORY:  The patient is a 30 year old [place] female, a [facility]  patient, who presented to the ER with complaints of left-sided upper  back pain that started on the day prior to admission.  She denied any  cough, though per ED she had been coughing.  The patient denied fevers  or chills.  She reported about a 12 pound weight loss in the several  months prior to admission.  She admitted to night sweats, and denied  shortness of breath.  She also admitted to polyuria and polydipsia.  It  was noted that she had moved to the [place] 10 years previously  and denied any TB exposure.  She was able to speak some English, but her  sister provided much of the history.   PHYSICAL EXAMINATION:  Upon admission, as per Dr. Cammie Mcgee L. Lendell Caprice,  revealed the temperature of 97.1 with a blood pressure of 129/91, pulse  of 102, respiratory rate of 18, O2 saturation 97% on room  air.  Pertinent findings on lung exam:  Clear to auscultation bilaterally  without crackles, wheezes or rhonchi.  The rest of her physical exam was  also reported to be within normal limits.   LABORATORY DATA:  A white cell count was 11 with a hemoglobin of 10.3,  hematocrit of 44.2, platelet count normal.  D-dimer 0.4.  Her  chemistries showed a blood glucose of 302, otherwise within normal  limits and Chest x-ray showed a 1.5 cm mass-like density.  The left lung  apex likely represented a pneumonia; and the CT scan result is as stated  above.   HOSPITAL COURSE:  Upon admission the patient was placed in respiratory  isolation and sputum for AFB stains and cultures ordered.  The patient  was also empirically placed on IV antibiotics.  A PPD was placed and the  results are as stated above.  The case was discussed with the  pulmonologist and he recommended that  a biopsy of the lesion be done  before the patient was discharged.  This was done; and it was negative  as stated above.  The patient was then discharged home to followup with  her primary care physician.  She was asymptomatic, hemodynamically  stable and afebrile.  She was to followup with her primary care  physician at [facility].   New onset diabetes mellitus.  Her blood sugars were elevated upon  admission and a hemoglobin A1c was done, and this was also elevated at  12.4.  The patient's Accu-Checks were monitored during her hospital  stay.  She was started on oral hypoglycemics.  She is to followup with  her primary care physician upon discharge.   DISCHARGE MEDICATIONS:  1. Amaryl 2 mg p.o. daily.  2. Glucophage 500 mg p.o. daily.   FOLLOWUP CARE:  Dr. Luciana Axe at [facility].      Kela Millin, M.D.  Electronically Signed     ACV/MEDQ  D:  11/20/2006  T:  11/20/2006  Job:  [id number]   cc:   [facility] Dr. Luciana Axe

## 2011-06-11 ENCOUNTER — Other Ambulatory Visit: Payer: Self-pay | Admitting: Internal Medicine

## 2011-07-04 LAB — DIFFERENTIAL
Basophils Absolute: 0
Basophils Relative: 0
Eosinophils Absolute: 0.1
Monocytes Absolute: 0.6
Neutro Abs: 12.3 — ABNORMAL HIGH

## 2011-07-04 LAB — I-STAT 8, (EC8 V) (CONVERTED LAB)
Acid-base deficit: 1
Bicarbonate: 22.6
Potassium: 3.9
TCO2: 24
pCO2, Ven: 34.5 — ABNORMAL LOW
pH, Ven: 7.425 — ABNORMAL HIGH

## 2011-07-04 LAB — CBC
Hemoglobin: 14
MCHC: 34
RDW: 12.8

## 2011-07-04 LAB — URINALYSIS, ROUTINE W REFLEX MICROSCOPIC
Bilirubin Urine: NEGATIVE
Glucose, UA: NEGATIVE
Hgb urine dipstick: NEGATIVE
Ketones, ur: NEGATIVE
pH: 6.5

## 2011-07-04 LAB — RAPID STREP SCREEN (MED CTR MEBANE ONLY): Streptococcus, Group A Screen (Direct): POSITIVE — AB

## 2013-08-04 ENCOUNTER — Emergency Department (HOSPITAL_COMMUNITY)
Admission: EM | Admit: 2013-08-04 | Discharge: 2013-08-04 | Disposition: A | Discharge status: Home / Self Care | Payer: Medicaid Other | Source: Home / Self Care | Attending: Family Medicine | Admitting: Family Medicine

## 2013-08-04 ENCOUNTER — Encounter (HOSPITAL_COMMUNITY): Payer: Self-pay | Admitting: Emergency Medicine

## 2013-08-04 DIAGNOSIS — IMO0001 Reserved for inherently not codable concepts without codable children: Secondary | ICD-10-CM

## 2013-08-04 DIAGNOSIS — E1165 Type 2 diabetes mellitus with hyperglycemia: Secondary | ICD-10-CM

## 2013-08-04 DIAGNOSIS — R358 Other polyuria: Secondary | ICD-10-CM

## 2013-08-04 DIAGNOSIS — R3589 Other polyuria: Secondary | ICD-10-CM

## 2013-08-04 DIAGNOSIS — R300 Dysuria: Secondary | ICD-10-CM

## 2013-08-04 HISTORY — DX: Type 2 diabetes mellitus without complications: E11.9

## 2013-08-04 LAB — POCT URINALYSIS DIP (DEVICE)
Bilirubin Urine: NEGATIVE
Glucose, UA: 500 mg/dL — AB
Specific Gravity, Urine: 1.025 (ref 1.005–1.030)

## 2013-08-04 LAB — POCT PREGNANCY, URINE: Preg Test, Ur: NEGATIVE

## 2013-08-04 LAB — POCT I-STAT, CHEM 8
BUN: 14 mg/dL (ref 6–23)
Creatinine, Ser: 0.6 mg/dL (ref 0.50–1.10)
Hemoglobin: 16 g/dL — ABNORMAL HIGH (ref 12.0–15.0)
Potassium: 3.3 mEq/L — ABNORMAL LOW (ref 3.5–5.1)
Sodium: 137 mEq/L (ref 135–145)

## 2013-08-04 MED ORDER — GLYBURIDE 5 MG PO TABS: 5.0000 mg | ORAL_TABLET | Freq: Two times a day (BID) | ORAL | Status: DC

## 2013-08-04 MED ORDER — CEPHALEXIN 500 MG PO CAPS: 500.0000 mg | ORAL_CAPSULE | Freq: Two times a day (BID) | ORAL | Status: DC

## 2013-08-04 MED ORDER — METFORMIN HCL 500 MG PO TABS: 500.0000 mg | ORAL_TABLET | Freq: Two times a day (BID) | ORAL | Status: DC

## 2013-08-04 NOTE — ED Provider Notes (Signed)
[name] is a 32 y.o. female who presents to Urgent Care today for urinary frequency and urgency associated with some dysuria. This is been present for 2 days. Patient denies any abdominal pain nausea vomiting or diarrhea. She feels well otherwise. She has not tried any medications yet. In hindsight patient notes that she has been diagnosed with diabetes in the past that she has not taken her diabetes medicines for the last 3 years. She's not sure what medicine she was taking. She recently got Medicaid again but has not reestablished with her primary care Dr..     Past Medical History  Diagnosis Date  . Diabetes mellitus without complication    History  Substance Use Topics  . Smoking status: Never Smoker   . Smokeless tobacco: Not on file  . Alcohol Use: No   ROS as above Medications reviewed. No current facility-administered medications for this encounter.   Current Outpatient Prescriptions  Medication Sig Dispense Refill  . cephALEXin (KEFLEX) 500 MG capsule Take 1 capsule (500 mg total) by mouth 2 (two) times daily.  14 capsule  0  . glyBURIDE (DIABETA) 5 MG tablet Take 1 tablet (5 mg total) by mouth 2 (two) times daily with a meal.  60 tablet  0  . metFORMIN (GLUCOPHAGE) 500 MG tablet Take 1 tablet (500 mg total) by mouth 2 (two) times daily with a meal.  60 tablet  0    Exam:  BP 132/91  Pulse 110  Temp(Src) 98.4 F (36.9 C) (Oral)  Resp 16  SpO2 98% Gen: Well NAD HEENT: EOMI,  MMM Lungs: Normal work of breathing. CTABL Heart: Tachycardia but regular no MRG Abd: NABS, Soft. NT, ND Exts: Non edematous BL  LE, warm and well perfused.   Results for orders placed during the hospital encounter of 08/04/13 (from the past 24 hour(s))  POCT URINALYSIS DIP (DEVICE)     Status: Abnormal   Collection Time    08/04/13  8:19 PM      Result Value Range   Glucose, UA 500 (*) NEGATIVE mg/dL   Bilirubin Urine NEGATIVE  NEGATIVE   Ketones, ur TRACE (*) NEGATIVE mg/dL   Specific  Gravity, Urine 1.025  1.005 - 1.030   Hgb urine dipstick SMALL (*) NEGATIVE   pH 6.0  5.0 - 8.0   Protein, ur 100 (*) NEGATIVE mg/dL   Urobilinogen, UA 0.2  0.0 - 1.0 mg/dL   Nitrite NEGATIVE  NEGATIVE   Leukocytes, UA NEGATIVE  NEGATIVE  POCT PREGNANCY, URINE     Status: None   Collection Time    08/04/13  8:22 PM      Result Value Range   Preg Test, Ur NEGATIVE  NEGATIVE  POCT I-STAT, CHEM 8     Status: Abnormal   Collection Time    08/04/13  8:35 PM      Result Value Range   Sodium 137  135 - 145 mEq/L   Potassium 3.3 (*) 3.5 - 5.1 mEq/L   Chloride 98  96 - 112 mEq/L   BUN 14  6 - 23 mg/dL   Creatinine, Ser 1.91  0.50 - 1.10 mg/dL   Glucose, Bld 478 (*) 70 - 99 mg/dL   Calcium, Ion 2.95  6.21 - 1.23 mmol/L   TCO2 24  0 - 100 mmol/L   Hemoglobin 16.0 (*) 12.0 - 15.0 g/dL   HCT 30.8 (*) 65.7 - 84.6 %   No results found.  Assessment and Plan: 32 y.o. female  with polyuria due to uncontrolled diabetes. Patient has a anion gap of 15.  She does not appear to be significantly dehydrated. Additionally patient has some dysuria symptoms. Plan: Urine culture Oral hydration and avoidance of sugar Resume metformin and glyburide.  Followup in 2 days for repeat glucose and electrolyte.  Followup with primary care provider ASAP.  Discussed warning signs or symptoms. Please see discharge instructions. Patient expresses understanding.      Rodolph Bong, MD 08/04/13 [phone]

## 2013-08-04 NOTE — ED Notes (Signed)
Concern for poss UTI; assessed by MD only

## 2013-08-06 LAB — URINE CULTURE: Special Requests: NORMAL

## 2013-08-06 NOTE — ED Notes (Signed)
Urine culture: 20,000 colonies Group  B Strep (S. Agalactiae)- no sensitivity due to the predictability of PCN.  Pt. treated with Keflex. Lab shown to Dr. Artis Flock and he said it is contaminant. Vassie Moselle 08/06/2013

## 2013-08-07 ENCOUNTER — Emergency Department (INDEPENDENT_AMBULATORY_CARE_PROVIDER_SITE_OTHER)
Admission: EM | Admit: 2013-08-07 | Discharge: 2013-08-07 | Disposition: A | Discharge status: Home / Self Care | Payer: Medicaid Other | Source: Home / Self Care | Attending: Family Medicine | Admitting: Family Medicine

## 2013-08-07 ENCOUNTER — Encounter (HOSPITAL_COMMUNITY): Payer: Self-pay | Admitting: Emergency Medicine

## 2013-08-07 DIAGNOSIS — E119 Type 2 diabetes mellitus without complications: Secondary | ICD-10-CM | POA: Diagnosis present

## 2013-08-07 DIAGNOSIS — E1169 Type 2 diabetes mellitus with other specified complication: Secondary | ICD-10-CM | POA: Diagnosis present

## 2013-08-07 DIAGNOSIS — E1129 Type 2 diabetes mellitus with other diabetic kidney complication: Secondary | ICD-10-CM

## 2013-08-07 LAB — POCT I-STAT, CHEM 8
Creatinine, Ser: 0.5 mg/dL (ref 0.50–1.10)
HCT: 45 % (ref 36.0–46.0)
Hemoglobin: 15.3 g/dL — ABNORMAL HIGH (ref 12.0–15.0)
Potassium: 3.3 mEq/L — ABNORMAL LOW (ref 3.5–5.1)
Sodium: 141 mEq/L (ref 135–145)
TCO2: 23 mmol/L (ref 0–100)

## 2013-08-07 MED ORDER — LORAZEPAM 2 MG/ML IJ SOLN
INTRAMUSCULAR | Status: AC
  Filled 2013-08-07: qty 1

## 2013-08-07 NOTE — ED Notes (Signed)
Written Rx for DM supplies give to pt from Dr. Denyse Amass for meter, strips, lancets etc... Placed in bin to scan Also info to [facility] given to pt Pt verbalized understanding.

## 2013-08-07 NOTE — ED Provider Notes (Signed)
[name] is a 32 y.o. female who presents to Urgent Care today for followup diabetes. Patient was seen on November 11 with polyuria found to be due to uncontrolled type 2 diabetes. Patient had been out of her diabetes medications for about 2-3 years. She was given metformin glyburide. She is significantly improved. She denies any polyuria or dysuria. She denies any fevers chills nausea vomiting or diarrhea.    Past Medical History  Diagnosis Date  . Diabetes mellitus without complication    History  Substance Use Topics  . Smoking status: Never Smoker   . Smokeless tobacco: Not on file  . Alcohol Use: No   ROS as above Medications reviewed. No current facility-administered medications for this encounter.   Current Outpatient Prescriptions  Medication Sig Dispense Refill  . cephALEXin (KEFLEX) 500 MG capsule Take 1 capsule (500 mg total) by mouth 2 (two) times daily.  14 capsule  0  . glyBURIDE (DIABETA) 5 MG tablet Take 1 tablet (5 mg total) by mouth 2 (two) times daily with a meal.  60 tablet  0  . metFORMIN (GLUCOPHAGE) 500 MG tablet Take 1 tablet (500 mg total) by mouth 2 (two) times daily with a meal.  60 tablet  0    Exam:  BP 115/81  Pulse 98  Temp(Src) 98.3 F (36.8 C) (Oral)  Resp 18  SpO2 100% Gen: Well NAD HEENT: EOMI,  MMM Lungs: Normal work of breathing. CTABL Heart: RRR no MRG Abd: NABS, Soft. NT, ND Exts: Non edematous BL  LE, warm and well perfused.   Results for orders placed during the hospital encounter of 08/07/13 (from the past 24 hour(s))  POCT I-STAT, CHEM 8     Status: Abnormal   Collection Time    08/07/13 10:34 AM      Result Value Range   Sodium 141  135 - 145 mEq/L   Potassium 3.3 (*) 3.5 - 5.1 mEq/L   Chloride 104  96 - 112 mEq/L   BUN 10  6 - 23 mg/dL   Creatinine, Ser 1.61  0.50 - 1.10 mg/dL   Glucose, Bld 096 (*) 70 - 99 mg/dL   Calcium, Ion 0.45 (*) 1.12 - 1.23 mmol/L   TCO2 23  0 - 100 mmol/L   Hemoglobin 15.3 (*) 12.0 - 15.0 g/dL    HCT 40.9  81.1 - 91.4 %   No results found.  Urine culture from November 19 is growing 20,000 colony-forming units of group B strep. Patient is currently treated with Keflex.   Assessment and Plan: 32 y.o. female with diabetes. Blood sugar much better controlled with metformin and low-dose  Glyburide.  Plan to followup with primary care provider ASAP.  Written a prescription for glucometer.  Discussed warning signs or symptoms. Please see discharge instructions. Patient expresses understanding.      Rodolph Bong, MD 08/07/13 1040

## 2013-08-07 NOTE — ED Notes (Signed)
Pt is here for a f/u from 11/19 for uncontrolled DM Voices no new concerns... Alert w/no signs of acute distress.

## 2013-11-20 ENCOUNTER — Emergency Department (INDEPENDENT_AMBULATORY_CARE_PROVIDER_SITE_OTHER)
Admission: EM | Admit: 2013-11-20 | Discharge: 2013-11-20 | Disposition: A | Discharge status: Home / Self Care | Payer: Self-pay | Source: Home / Self Care

## 2013-11-20 ENCOUNTER — Encounter (HOSPITAL_COMMUNITY): Payer: Self-pay | Admitting: Emergency Medicine

## 2013-11-20 DIAGNOSIS — N390 Urinary tract infection, site not specified: Secondary | ICD-10-CM

## 2013-11-20 DIAGNOSIS — R81 Glycosuria: Secondary | ICD-10-CM

## 2013-11-20 DIAGNOSIS — E119 Type 2 diabetes mellitus without complications: Secondary | ICD-10-CM

## 2013-11-20 LAB — POCT PREGNANCY, URINE: Preg Test, Ur: NEGATIVE

## 2013-11-20 LAB — POCT URINALYSIS DIP (DEVICE)
Bilirubin Urine: NEGATIVE
Glucose, UA: 500 mg/dL — AB
Ketones, ur: NEGATIVE mg/dL
Nitrite: NEGATIVE
PROTEIN: 100 mg/dL — AB
Urobilinogen, UA: 0.2 mg/dL (ref 0.0–1.0)
pH: 5.5 (ref 5.0–8.0)

## 2013-11-20 MED ORDER — PHENAZOPYRIDINE HCL 200 MG PO TABS: 200.0000 mg | ORAL_TABLET | Freq: Three times a day (TID) | ORAL | Status: DC

## 2013-11-20 MED ORDER — CEPHALEXIN 500 MG PO CAPS: 500.0000 mg | ORAL_CAPSULE | Freq: Four times a day (QID) | ORAL | Status: DC

## 2013-11-20 NOTE — ED Notes (Signed)
Pain with urination, onset 3/6

## 2013-11-20 NOTE — ED Provider Notes (Signed)
CSN: [id number]     Arrival date & time 11/20/13  1202 History   First MD Initiated Contact with Patient 11/20/13 1342     Chief Complaint  Patient presents with  . Urinary Tract Infection   (Consider location/radiation/quality/duration/timing/severity/associated sxs/prior Treatment) HPI Comments: C/O urinary urgency, dysuria and small volume voids.    Past Medical History  Diagnosis Date  . Diabetes mellitus without complication    History reviewed. No pertinent past surgical history. No family history on file. History  Substance Use Topics  . Smoking status: Never Smoker   . Smokeless tobacco: Not on file  . Alcohol Use: No   OB History   Grav Para Term Preterm Abortions TAB SAB Ect Mult Living                 Review of Systems  Constitutional: Negative.   Gastrointestinal: Negative for nausea, vomiting and abdominal pain.  Endocrine: Positive for polyuria.  Genitourinary: Positive for dysuria and urgency. Negative for flank pain, vaginal bleeding, vaginal discharge, vaginal pain and pelvic pain.       As per HPI    Allergies  Review of patient's allergies indicates no known allergies.  Home Medications   Current Outpatient Rx  Name  Route  Sig  Dispense  Refill  . cephALEXin (KEFLEX) 500 MG capsule   Oral   Take 1 capsule (500 mg total) by mouth 4 (four) times daily.   28 capsule   0   . glyBURIDE (DIABETA) 5 MG tablet   Oral   Take 1 tablet (5 mg total) by mouth 2 (two) times daily with a meal.   60 tablet   0   . metFORMIN (GLUCOPHAGE) 500 MG tablet   Oral   Take 1 tablet (500 mg total) by mouth 2 (two) times daily with a meal.   60 tablet   0   . phenazopyridine (PYRIDIUM) 200 MG tablet   Oral   Take 1 tablet (200 mg total) by mouth 3 (three) times daily.   6 tablet   0    BP 129/86  Pulse 98  Temp(Src) 98 F (36.7 C) (Oral)  Resp 18  SpO2 100% Physical Exam  Nursing note and vitals reviewed. Constitutional: She is oriented to person,  place, and time. She appears well-developed and well-nourished. No distress.  Eyes: Conjunctivae and EOM are normal.  Neck: Normal range of motion. Neck supple.  Cardiovascular: Normal rate.   Pulmonary/Chest: Effort normal. No respiratory distress.  Musculoskeletal: She exhibits no edema.  Neurological: She is alert and oriented to person, place, and time.  Skin: Skin is warm and dry.  Psychiatric: She has a normal mood and affect.    ED Course  Procedures (including critical care time) Labs Review Labs Reviewed  POCT URINALYSIS DIP (DEVICE) - Abnormal; Notable for the following:    Glucose, UA 500 (*)    Hgb urine dipstick SMALL (*)    Protein, ur 100 (*)    Leukocytes, UA SMALL (*)    All other components within normal limits  URINE CULTURE  POCT PREGNANCY, URINE   Imaging Review No results found. Results for orders placed during the hospital encounter of 11/20/13  POCT URINALYSIS DIP (DEVICE)      Result Value Ref Range   Glucose, UA 500 (*) NEGATIVE mg/dL   Bilirubin Urine NEGATIVE  NEGATIVE   Ketones, ur NEGATIVE  NEGATIVE mg/dL   Specific Gravity, Urine >=1.030  1.005 - 1.030   Hgb  urine dipstick SMALL (*) NEGATIVE   pH 5.5  5.0 - 8.0   Protein, ur 100 (*) NEGATIVE mg/dL   Urobilinogen, UA 0.2  0.0 - 1.0 mg/dL   Nitrite NEGATIVE  NEGATIVE   Leukocytes, UA SMALL (*) NEGATIVE  POCT PREGNANCY, URINE      Result Value Ref Range   Preg Test, Ur NEGATIVE  NEGATIVE     MDM   1. UTI (lower urinary tract infection)   2. T2DM (type 2 diabetes mellitus)   3. Glycosuria     Though she has glycosuria she has sx's of UTI with U/A +WBC Keflex 500 qid Urine cult Pyridium Plenty of fluids Must obtain a PCP for diabetes management.     Hayden Rasmussen, NP 11/20/13 1407

## 2013-11-20 NOTE — Discharge Instructions (Signed)
?i Tho ???ng Tp 2, Ng??i L?n (Type 2 Diabetes Mellitus, Adult) ?i tho ???ng tp 2, th??ng g?i ??n gi?n l b?nh ti?u ???ng tp 2, l m?t b?nh ko di (m?n tnh). Trong b?nh ti?u ???ng tp 2, tuy?n t?y khng s?n xu?t ?? insulin (hocmon), cc t? bo t ?p ?ng v?i insulin ???c t?o ra (khng insulin) ho?c c? hai. Thng th??ng, insulin v?n chuy?n ???ng t? th?c ?n vo cc t? bo m. Cc t? bo m s? d?ng ???ng cho n?ng l??ng. Thi?u h?t insulin ho?c khng ?p ?ng bnh th??ng v?i insulin gy ra ???ng d? th?a tch t? trong mu thay v ?i vo cc t? bo m. K?t qu? l, l??ng ???ng trong mu cao ( t?ng ???ng huy?t) pht tri?n. ?nh h??ng c?a hm l??ng ???ng (glucoza) cao c th? gy ra nhi?u bi?n ch?ng. B?nh ti?u ???ng tp 2 tr??c ?y cn ???c g?i l b?nh ti?u ???ng kh?i pht ? ng??i l?n nh?ng n c th? x?y ra ? m?i l?a tu?i. CC Y?U T? NGUY C? M?t ng??i d? pht tri?n b?nh ti?u ???ng tp 2 n?u c ai ? trong gia ?nh b? b?nh ny ??ng th?i c m?t ho?c nhi?u y?u t? nguy c? chnh sau ?y:  Th?a cn.  L?i s?ng t ho?t ??ng.  Ti?n s? lin t?c ?n th?c ?n giu n?ng l??ng. Duy tr cn n?ng bnh th??ng v ho?t ??ng th? ch?t th??ng xuyn c th? lm gi?m nguy c? pht tri?n b?nh ti?u ???ng tp 2. TRI?U CH?NG M?t ng??i b? b?nh ti?u ???ng tp 2 c th? khng c cc tri?u ch?ng ban ??u. Cc tri?u ch?ng c?a b?nh ti?u ???ng tp 2 xu?t hi?n t? t?. Cc tri?u ch?ng bao g?m:  Gia t?ng kht n??c (ch?ng kht nhi?u).  Gia t?ng ti?u ti?n (?a ni?u).  T?ng ?i ti?u vo ban ?m (ti?u ?m).  Gi?m cn. Hi?n t??ng gi?m cn ny c th? r?t nhanh.  Th??ng xuyn b? nhi?m trng ti pht.  M?t m?i.  Y?u.  Thay ??i th? l?c, nh? nhn m?.  Mi tri cy trong h?i th? c?a b?n.  ?au b?ng.  Bu?n nn ho?c nn m?a.  V?t c?t ho?c v?t b?m tm lu lnh.  ?au bu?t ho?c t ? bn tay ho?c bn chn. CH?N ?ON B?nh ti?u ???ng tp 2 th??ng khng ???c ch?n ?on cho ??n khi xu?t hi?n bi?n ch?ng c?a b?nh ti?u ???ng. B?nh ti?u ???ng tp 2  ???c ch?n ?on khi xu?t hi?n cc tri?u ch?ng c?ng nh? bi?n ch?ng v khi l??ng ???ng huy?t t?ng. L??ng ???ng huy?t c th? ???c ki?m tra b?ng m?t ho?c nhi?u xt nghi?m mu sau ?y:  Xt nghi?m ???ng huy?t nh?n ?n. B?n s? khng ???c php ?n trong t nh?t 8 ti?ng tr??c khi l?y m?u mu.  Xt nghi?m ???ng huy?t ng?u nhin. ???ng huy?t ???c xt nghi?m b?t k? lc no trong ngy, b?t k? b?n ?n lc no.  Xt nghi?m ???ng huy?t A1c huy?t s?c t? t?. Xt nghi?m A1c huy?t c?u t? cung c?p thng tin v? ki?m sot ???ng huy?t trong 3 thng tr??c ?.  Xt nghi?m dung n?p glucoza theo ???ng u?ng (OGTT). ???ng huy?t c?a b?n ???c ?o sau khi b?n ch?a ?n (?n chay) trong 2 gi? v sau ? sau khi b?n u?ng ?? u?ng c ch?a glucoza. ?I?U TR?  B?n c th? c?n dng insulin ho?c thu?c tr? ti?u ???ng hng ngy ?? gi? cho l??ng ???ng huy?t trong ph?m vi mong mu?n.  B?n s?  c?n s? d?ng li?u insulin ph h?p v?i vi?c t?p th? d?c v l?a ch?n th?c ph?m c l?i cho s?c kh?e. M?c tiu ?i?u tr? l ?? duy tr l??ng ???ng trong mu tr??c b?a ?n (glucoza tr??c b?a ?n) ? m?c 70-130 mg/dL. H??NG D?N CH?M [place] T?I NH  L??ng A1c huy?t s?c t? c?a b?n ???c ki?m tra hai l?n m?t n?m.  Th?c hi?n theo di ???ng huy?t hng ngy theo ch? d?n c?a chuyn gia ch?m [place] s?c kh?e.  Theo di [place] n??c ti?u khi b?n b? b?nh v theo ch? d?n c?a chuyn gia ch?m [place] s?c kh?e.  S? d?ng thu?c tr? ti?u ???ng ho?c insulin theo ch? d?n c?a chuyn gia ch?m [place] s?c kh?e ?? duy tr l??ng ???ng huy?t trong ph?m vi mong mu?n.  Khng bao gi? ?? h?t thu?c tr? ti?u ???ng ho?c insulin. Insulin c?n hng ngy.  ?i?u ch?nh insulin d?a vo m?c ?? s? d?ng hy?rat-cacbon c?a b?n. Hy?rat-cacbon c th? lm t?ng l??ng ???ng huy?t nh?ng c?n ph?i ???c bao g?m trong ch? ?? ?n u?ng c?a b?n. Hy?rat-cacbon cung c?p vitamin, khong ch?t v ch?t x?, l m?t ph?n thi?t y?u c?a ch? ?? ?n u?ng lnh m?nh. Hy?rat-cacbon ???c tm th?y trong tri cy, rau, ng? c?c, cc s?n ph?m t? s?a, cc lo?i ??u  v cc lo?i th?c ph?m c ch?a thm ???ng.    ?n th?c ph?m c l?i cho s?c kh?e. Xen k? 3 b?a ?n v?i 3 mn ?n nh?.  Gi?m cn n?u th?a cn.  Mang theo th? c?nh bo y t? ho?c ?eo ?? trang s?c c?nh bo y t?.  Mang theo ?? ?n nh? ch?a 15 gam hy?rat-cacbon cng b?n m?i lc ?? ?i?u tr? h? ???ng huy?t (h? ???ng huy?t). M?t s? v d? v? ?? ?n nh? ch?a 15 gam hy?rat-cacbon bao g?m:  Vin nn glucoza, 3 ho?c 4   Gel glucoza, ?ng 15 gam  Nho kh, 2 mu?ng (24 gam)  Th?ch hnh h?t ??u, 6  Bnh quy hnh con gi?ng, 8  N??c u?ng c b?t thng th??ng, 4 aox? (120 ml)  K?o chp chp, 9  Pht hi?n h? ???ng huy?t. H? ???ng huy?t x?y ra v?i l??ng ???ng huy?t t? 70 mg/dL tr? xu?ng. Nguy c? h? ???ng huy?t gia t?ng khi nh?n ?n ho?c b? b?a, trong v sau khi t?p th? d?c c??ng ?? cao v trong khi ng?. Cc tri?u ch?ng h? ???ng huy?t c th? bao g?m:  Run ho?c l?c.  Gi?m kh? n?ng t?p trung.  ?? m? hi.  Nh?p tim t?ng.  ?au ??u.  Kh mi?ng.  ?i.  Tnh d? kch thch.  Lo u.  Ng? b?n ch?n.  Thay ??i l?i ni ho?c s? ph?i h?p.  B? l l?n.  ?i?u tr? h? ???ng huy?t k?p th?i. N?u b?n t?nh to v c th? nu?t m?t cch an ton, hy theo quy t?c 15:15:  Dng 15-20 gam glucoza ho?c hy?rat-cacbon tc ??ng nhanh. L?a ch?n tc ??ng nhanh bao g?m gel glucoza, vin nn glucoza ho?c 4 aox? (120 ml) n??c p tri cy, soda bnh th??ng ho?c s?a t bo.  Ki?m tra l??ng ???ng huy?t c?a b?n 15 pht sau khi u?ng glucoza.  Dng t? 15-20 gam glucoza tr? ln n?u l??ng ???ng huy?t ???c ?o l?i v?n ? m?c 70 mg/dL tr? xu?ng.  ?n m?t b?a ?n bnh th??ng ho?c ?? ?n nh? trong vng 1 gi? sau khi l??ng ???ng huy?t tr? l?i bnh th??ng.  Hy ??  ch?ng ?a ni?u v ch?ng kht nhi?u, ? l nh?ng d?u hi?u s?m t?ng ???ng huy?t. Vi?c pht hi?n t?ng ???ng huy?t s?m cho php ?i?u tr? k?p th?i. ?i?u tr? t?ng ???ng huy?t theo ch? d?n c?a chuyn gia ch?m [place] s?c kh?e.  M?i tu?n tham gia vo t nh?t 150 pht ho?t ??ng th? ch?t  c??ng ?? trung bnh, tr?i r?ng trn t nh?t 3 ngy trong tu?n ho?c theo ch? d?n c?a chuyn gia ch?m scs?c kh?e. Ngoi ra, b?n nn tham gia vo bi t?p s?c ?? khng t nh?t 2 l?n m?t tu?n ho?c theo ch? d?n c?a chuyn gia ch?m [place] s?c kh?e.  ?i?u ch?nh thu?c v l??ng th?c ?n khi c?n n?u b?n b?t ??u bi t?p ho?c mn th? thao m?i.  Theo k? ho?ch ngy b?nh c?a b?n b?t c? lc no b?n khng th? ?n ho?c u?ng bnh th??ng.  Trnh s? d?ng thu?c l.  Gi?i h?n l??ng r??u b?n u?ng khng qu 1 ly m?i ngy v?i ph? n? khng mang thai v 2 ly m?i ngy v?i nam gi?i. B?n ch? nn u?ng r??u km theo ?n. Ni chuy?n v?i chuyn gia ch?m [place] s?c kh?e xem u?ng r??u c an ton cho b?n khng. Cho chuyn gia ch?m [place] s?c kh?e bi?t n?u b?n u?ng r??u vi l?n m?t tu?n.  G?p chuyn gia ch?m [place] s?c kh?e ?? khm l?i th??ng xuyn.  S?p x?p bu?i khm m?t ngay sau khi ch?n ?on b?nh ti?u ???ng tp 2 v sau ? hng n?m.  Th?c hi?n ch?m [place] da v bn chn hng ngy. Ki?m tra da v bn chn hng ngy xem c v?t c?t, v?t b?m tm, t?y ??, v?n ?? v? mng tay, ch?y mu, m?n n??c hay l? lot khng. Bn chn c?n ???c khm b?i chuyn gia ch?m [place] s?c kh?e hng n?m.  ?nh r?ng v n??u t nh?t hai l?n m?t ngy v dng ch? nha khoa t nh?t m?t l?n m?t ngy. G?p nha s? ?? khm l?i th??ng xuyn.  Chia s? k? ho?ch qu?n l b?nh ti?u ???ng c?a b?n v?i n?i lm vi?c ho?c tr??ng h?c c?a b?n.  Gi? c?p nh?t v?i vi?c ch?ng ng?a.  H?c qu?n l c?ng th?ng.  Tm ki?m gio d?c v h? tr? v? b?nh ti?u ???ng th??ng xuyn khi c?n.  Tham gia ho?c tm cch ph?c h?i ch?c n?ng khi c?n thi?t ?? duy tr ho?c c?i thi?n tnh ??c l?p v ch?t l??ng cu?c s?ng. Yu c?u gi?i thi?u v?t l tr? li?u ho?c li?u php ngh? nghi?p n?u b?n b? t bn chn ho?c t tay ho?c nh?ng kh kh?n v?i vi?c ch?i ??u, m?c qu?n o, ?n u?ng ho?c ho?t ??ng th? ch?t. HY ?I KHM N?U:  B?n khng th? ?n th?c ?n ho?c u?ng ?? u?ng trong h?n 6 gi?.  B?n b? bu?n nn v nn m?a trong h?n 6  gi?.  L??ng ???ng huy?t c?a b?n cao trn 240 mg/dL.  C s? thay ??i tr?ng thi tinh th?n.  B?n b? thm m?t c?n b?nh nghim tr?ng.  B?n b? tiu ch?y trong h?n 6 gi?.  B?n ? b? m?t ho?c b? s?t trong m?t vi ngy v khng kh h?n.  B?n b? ?au trong khi tham gia b?t k? ho?t ??ng th? ch?t no. HY NGAY L?P T?C ?I KHM N?U:  B?n b? kh th?.  B?n c l??ng [place] ? m?c trung bnh ??n cao. ??M B?O B?N:  Hi?u cc h??ng d?n ny.  S? theo di tnh tr?ng c?a mnh.  S? yu c?u tr? gip ngay l?p t?c n?u b?n c?m th?y khng ?? ho?c tnh tr?ng tr?m tr?ng h?n. Document Released: 09/02/2005 Document Revised: 05/05/2013 [facility] Patient Information 2014 [place], [place].  Nhi?m Trng ???ng Ti?t Ni?u (Urinary Tract Infection) Nhi?m trng ???ng ti?t ni?u (UTI) c th? pht tri?n ?? b?t c? vi? tri? na?o d?c ???ng ti?t ni?u. ???ng ti?t ni?u l h? th?ng thot n??c c?a c? th? ?? lo?i b? ch?t th?i v n??c d? th?a. ???ng ti?t ni?u bao g?m hai th?n, ni?u qu?n, bng quang v ni?u ??o. Th?n l m?t c?p c? quan hnh h?t ??u. M?i th?n co? kch th??c kho?ng b?ng bn tay c?a b?n. Chng n?m d??i x??ng s??n, m?i bn c?t s?ng c m?t qu?[place] NHN Nhi?m trng gy b?i vi sinh v?t, l cc sinh v?t nh?, bao g?m n?m, vi rt v vi khu?n. Nh?ng sinh v?t ny nh? t?i m?c chng ch? c th? ???c nhn th?y qua knh hi?n vi. Vi khu?n l nh?ng vi sinh v?t ph? bi?n nh?t gy ra UTI. TRI?U CH?NG Cc tri?u ch?ng c?a UTI c th? khc nhau, ty theo ?? tu?i v gi?i tnh c?a b?nh nhn v b?i v? tr nhi?m trng. Cc tri?u ch?ng ? ph? n? tr? th??ng bao g?m nhu c?u ti?u ti?n th??ng xuyn v d? d?i, c?m gic ?au v rt ? bng quang ho?c ni?u ??o khi ?i ti?u. Ph? n? v nam gi?i l?n tu?i c nhi?u kh? n?ng b? m?t m?i, run r?y v y?u, ??ng th?i b? ?au c? v ?au b?ng. S?t c th? c ngh?a l nhi?m trng ? th?n. Cc tri?u ch?ng khc c?a nhi?m trng th?n bao g?m ?au ? l?ng ho?c hai bn d??i x??ng s??n, bu?n nn v nn m?a. CH?N ?ON ?? ch?n ?on  UTI, chuyn gia ch?m [place] s?c kh?e s? h?i b?n v? nh?ng tri?u ch?ng c?a b?n. Chuyn gia ch?m [place] s?c kh?e c?ng s? yu c?u cung c?p m?u n??c ti?u. M?u n??c ti?u s? ???c xt nghi?m xem c vi khu?n v cc t? bo mu tr?ng khng. Cc t? bo mu tr?ng ???c t?o b?i c? th? ?? gip ch?ng l?i nhi?m trng. ?I?U TR? Thng th??ng, UTI c th? ???c ?i?u tr? b?ng thu?c. B?i v h?u h?t nguyn nhn gy ra UTI l do nhi?m trng b?i vi khu?n, chng th??ng c th? ???c ?i?u tr? b?ng cch s? d?ng thu?c khng sinh. Vi?c l?a ch?n khng sinh v th?i gian ?i?u tr? ph? thu?c vo tri?u ch?ng v lo?i vi khu?n gy nhi?m trng. H??NG D?N CH?M [place] T?I NH  N?u b?n ? ???c k khng sinh, hy s? d?ng chng ?ng nh? chuyn gia ch?m [place] s?c kh?e h??ng d?n. S? d?ng h?t thu?c ngay c? khi b?n c?m th?y kh h?n sau khi ch? dng m?t ph?n thu?c.  U?ng ?? n??c v dung d?ch ?? n??c ti?u trong ho?c c mu vng nh?t.  Trnh caffeine, tr v ?? u?ng c ga. Chng c xu h??ng kch thch bng quang.  ?i ti?u th??ng xuyn. Trnh nh?n ti?u trong th?i gian di.  ?i ti?u tr??c v sau khi quan h? tnh d?c.  Sau khi ?i ??i ti?n, ph? n? c?n lm s?ch t? tr??c ra sau. Ch? s? d?ng gi?y v? sinh m?t l?n. HY ?I KHM N?U:  B?n b? ?au l?ng.  B?n b? s?t.  Cc tri?u ch?ng c?a b?n khng b?t ??u ?? trong vng 3 ngy. HY NGAY L?P  T?C ?I KHM N?U:  B?n b? ?au l?ng ho?c ?au b?ng d??i nghim tr?ng.  B?n b? ?n l?nh.  B?n b? bu?n nn ho?c nn m?a.  B?n lin t?c b? rt ho?c kh ch?u khi ?i ti?u. ??M B?O B?N:  Hi?u cc h??ng d?n ny.  S? theo di tnh tr?ng c?a mnh.  S? yu c?u tr? gip ngay l?p t?c n?u b?n c?m th?y khng ?? ho?c tnh tr?ng tr?m tr?ng h?n. Document Released: 09/02/2005 Document Revised: 05/05/2013 [facility] Patient Information 2014 [place], [place].

## 2013-11-20 NOTE — ED Provider Notes (Signed)
Medical screening examination/treatment/procedure(s) were performed by resident physician or non-physician practitioner and as supervising physician I was immediately available for consultation/collaboration.   Olesya Wike DOUGLAS MD.   Martrell Eguia D Meyer Dockery, MD 11/20/13 1452 

## 2013-11-21 LAB — URINE CULTURE
Colony Count: 55000
Special Requests: NORMAL

## 2013-11-22 NOTE — ED Notes (Signed)
Urine culture: 55,000 colonies Group B strep (S. Agalactiae).  Pt. adequately treated with Keflex. Vassie MoselleYork, Tarick Parenteau M 11/22/2013

## 2014-02-26 ENCOUNTER — Encounter (HOSPITAL_COMMUNITY): Payer: Self-pay | Admitting: Emergency Medicine

## 2014-02-26 ENCOUNTER — Emergency Department (HOSPITAL_COMMUNITY)
Admission: EM | Admit: 2014-02-26 | Discharge: 2014-02-27 | Disposition: A | Discharge status: Home / Self Care | Payer: Medicaid Other | Attending: Emergency Medicine | Admitting: Emergency Medicine

## 2014-02-26 ENCOUNTER — Emergency Department (INDEPENDENT_AMBULATORY_CARE_PROVIDER_SITE_OTHER)
Admission: EM | Admit: 2014-02-26 | Discharge: 2014-02-26 | Disposition: A | Discharge status: Nursing Home (Includes ICF) | Payer: Self-pay | Source: Home / Self Care | Attending: Emergency Medicine | Admitting: Emergency Medicine

## 2014-02-26 DIAGNOSIS — B379 Candidiasis, unspecified: Secondary | ICD-10-CM

## 2014-02-26 DIAGNOSIS — Z79899 Other long term (current) drug therapy: Secondary | ICD-10-CM | POA: Insufficient documentation

## 2014-02-26 DIAGNOSIS — E119 Type 2 diabetes mellitus without complications: Secondary | ICD-10-CM | POA: Insufficient documentation

## 2014-02-26 DIAGNOSIS — R739 Hyperglycemia, unspecified: Secondary | ICD-10-CM

## 2014-02-26 DIAGNOSIS — B373 Candidiasis of vulva and vagina: Secondary | ICD-10-CM | POA: Insufficient documentation

## 2014-02-26 DIAGNOSIS — R7309 Other abnormal glucose: Secondary | ICD-10-CM

## 2014-02-26 DIAGNOSIS — Z792 Long term (current) use of antibiotics: Secondary | ICD-10-CM | POA: Insufficient documentation

## 2014-02-26 DIAGNOSIS — B3731 Acute candidiasis of vulva and vagina: Secondary | ICD-10-CM | POA: Insufficient documentation

## 2014-02-26 LAB — CBC WITH DIFFERENTIAL/PLATELET
Basophils Absolute: 0 K/uL (ref 0.0–0.1)
Basophils Relative: 0 % (ref 0–1)
Eosinophils Absolute: 0.1 K/uL (ref 0.0–0.7)
Eosinophils Relative: 1 % (ref 0–5)
HCT: 40.8 % (ref 36.0–46.0)
Hemoglobin: 14 g/dL (ref 12.0–15.0)
Lymphocytes Relative: 47 % — ABNORMAL HIGH (ref 12–46)
Lymphs Abs: 5.5 K/uL — ABNORMAL HIGH (ref 0.7–4.0)
MCH: 28.3 pg (ref 26.0–34.0)
MCHC: 34.3 g/dL (ref 30.0–36.0)
MCV: 82.4 fL (ref 78.0–100.0)
Monocytes Absolute: 0.5 K/uL (ref 0.1–1.0)
Monocytes Relative: 5 % (ref 3–12)
Neutro Abs: 5.4 K/uL (ref 1.7–7.7)
Neutrophils Relative %: 47 % (ref 43–77)
Platelets: 309 K/uL (ref 150–400)
RBC: 4.95 MIL/uL (ref 3.87–5.11)
RDW: 12.6 % (ref 11.5–15.5)
WBC: 11.6 K/uL — ABNORMAL HIGH (ref 4.0–10.5)

## 2014-02-26 LAB — POCT I-STAT, CHEM 8
BUN: 7 mg/dL (ref 6–23)
Calcium, Ion: 1.22 mmol/L (ref 1.12–1.23)
Chloride: 96 meq/L (ref 96–112)
Creatinine, Ser: 0.5 mg/dL (ref 0.50–1.10)
Glucose, Bld: 406 mg/dL — ABNORMAL HIGH (ref 70–99)
HCT: 47 % — ABNORMAL HIGH (ref 36.0–46.0)
Hemoglobin: 16 g/dL — ABNORMAL HIGH (ref 12.0–15.0)
Potassium: 4.2 meq/L (ref 3.7–5.3)
Sodium: 137 meq/L (ref 137–147)
TCO2: 28 mmol/L (ref 0–100)

## 2014-02-26 LAB — COMPREHENSIVE METABOLIC PANEL WITH GFR
ALT: 28 U/L (ref 0–35)
AST: 21 U/L (ref 0–37)
Albumin: 3.6 g/dL (ref 3.5–5.2)
Alkaline Phosphatase: 79 U/L (ref 39–117)
BUN: 8 mg/dL (ref 6–23)
CO2: 23 meq/L (ref 19–32)
Calcium: 9.6 mg/dL (ref 8.4–10.5)
Chloride: 96 meq/L (ref 96–112)
Creatinine, Ser: 0.33 mg/dL — ABNORMAL LOW (ref 0.50–1.10)
GFR calc Af Amer: >90 mL/min
GFR calc non Af Amer: >90 mL/min
Glucose, Bld: 363 mg/dL — ABNORMAL HIGH (ref 70–99)
Potassium: 3.9 meq/L (ref 3.7–5.3)
Sodium: 136 meq/L — ABNORMAL LOW (ref 137–147)
Total Bilirubin: 0.3 mg/dL (ref 0.3–1.2)
Total Protein: 8 g/dL (ref 6.0–8.3)

## 2014-02-26 LAB — POCT URINALYSIS DIP (DEVICE)
Bilirubin Urine: NEGATIVE
Glucose, UA: >=1000 mg/dL — AB
Hgb urine dipstick: NEGATIVE
Ketones, ur: NEGATIVE mg/dL
Leukocytes, UA: NEGATIVE
Nitrite: NEGATIVE
Protein, ur: NEGATIVE mg/dL
Specific Gravity, Urine: 1.015 (ref 1.005–1.030)
Urobilinogen, UA: 0.2 mg/dL (ref 0.0–1.0)
pH: 7.5 (ref 5.0–8.0)

## 2014-02-26 LAB — WET PREP, GENITAL
Clue Cells Wet Prep HPF POC: NONE SEEN
Trich, Wet Prep: NONE SEEN

## 2014-02-26 LAB — POCT PREGNANCY, URINE: Preg Test, Ur: NEGATIVE

## 2014-02-26 MED ORDER — SODIUM CHLORIDE 0.9 % IV BOLUS (SEPSIS)
2000.0000 mL | Freq: Once | INTRAVENOUS | Status: AC
  Administered 2014-02-26: 2000 mL via INTRAVENOUS

## 2014-02-26 NOTE — ED Provider Notes (Signed)
CSN: [id number]     Arrival date & time 02/26/14  1838 History   First MD Initiated Contact with Patient 02/26/14 1934     Chief Complaint  Patient presents with  . Urinary Tract Infection   (Consider location/radiation/quality/duration/timing/severity/associated sxs/prior Treatment) HPI Comments: Patient states she thinks she has a UTI. Has been non-compliant with medication prescribed for T2DM PCP: none  Patient is a 33 y.o. female presenting with frequency. The history is provided by the patient and a relative.  Urinary Frequency This is a recurrent problem. Episode onset: states issue in ongoing?? The problem occurs constantly.    Past Medical History  Diagnosis Date  . Diabetes mellitus without complication    History reviewed. No pertinent past surgical history. History reviewed. No pertinent family history. History  Substance Use Topics  . Smoking status: Never Smoker   . Smokeless tobacco: Not on file  . Alcohol Use: No   OB History   Grav Para Term Preterm Abortions TAB SAB Ect Mult Living                 Review of Systems  Constitutional: Positive for fatigue.  HENT: Negative.   Eyes: Negative.   Respiratory: Negative.   Cardiovascular: Negative.   Gastrointestinal: Negative.   Endocrine: Positive for polydipsia and polyuria. Negative for polyphagia.  Genitourinary: Positive for urgency and frequency. Negative for dysuria, hematuria, flank pain, difficulty urinating, genital sores and pelvic pain.  Musculoskeletal: Negative.     Allergies  Review of patient's allergies indicates no known allergies.  Home Medications   Prior to Admission medications   Medication Sig Start Date End Date Taking? Authorizing Provider  cephALEXin (KEFLEX) 500 MG capsule Take 1 capsule (500 mg total) by mouth 4 (four) times daily. 11/20/13   Hayden Rasmussenavid Mabe, NP  glyBURIDE (DIABETA) 5 MG tablet Take 1 tablet (5 mg total) by mouth 2 (two) times daily with a meal. 08/04/13   Rodolph BongEvan S  Corey, MD  metFORMIN (GLUCOPHAGE) 500 MG tablet Take 1 tablet (500 mg total) by mouth 2 (two) times daily with a meal. 08/04/13   Rodolph BongEvan S Corey, MD  phenazopyridine (PYRIDIUM) 200 MG tablet Take 1 tablet (200 mg total) by mouth 3 (three) times daily. 11/20/13   Hayden Rasmussenavid Mabe, NP   BP 132/70  Pulse 72  Temp(Src) 98.6 F (37 C) (Oral)  Resp 18  SpO2 100%  LMP 02/07/2014 Physical Exam  Nursing note and vitals reviewed. Constitutional: She is oriented to person, place, and time. She appears well-developed and well-nourished. No distress.  HENT:  Head: Normocephalic and atraumatic.  Mouth/Throat: Oropharynx is clear and moist.  Eyes: Conjunctivae are normal. No scleral icterus.  Cardiovascular: Normal rate, regular rhythm and normal heart sounds.   Pulmonary/Chest: Effort normal and breath sounds normal.  Abdominal: Soft. Bowel sounds are normal. She exhibits no distension. There is no tenderness.  Musculoskeletal: Normal range of motion.  Neurological: She is alert and oriented to person, place, and time.  Skin: Skin is warm and dry.  Psychiatric: She has a normal mood and affect. Her behavior is normal.    ED Course  Procedures (including critical care time) Labs Review Labs Reviewed  POCT URINALYSIS DIP (DEVICE) - Abnormal; Notable for the following:    Glucose, UA >=1000 (*)    All other components within normal limits  POCT I-STAT, CHEM 8 - Abnormal; Notable for the following:    Glucose, Bld 406 (*)    Hemoglobin 16.0 (*)  HCT 47.0 (*)    All other components within normal limits  POCT PREGNANCY, URINE    Imaging Review No results found.   MDM   1. Hyperglycemia    UA without indication of infection, only glucosuria without ketonuria. Istat-8 consistent with hemoconcentration and hyperglycemia with glucose = 406. [facility] and [facility].    Jess BartersJennifer Lee TustinPresson, GeorgiaPA 02/26/14 [phone]

## 2014-02-26 NOTE — ED Notes (Signed)
Pt   Reports  Symptoms  Of  Urinary  Frequency  Discomfort  And  Burning  Has  History  Of  uti  In past  And  Is  Also a  Non  Compliant  Diabetic

## 2014-02-26 NOTE — ED Notes (Signed)
The pt came from ucc with high blood sugar and poss uti with vaginal itching she has had for 2 weeks.  lmp may 23rd.  She had a urine and blood drawn at ucc

## 2014-02-26 NOTE — ED Provider Notes (Signed)
Medical screening examination/treatment/procedure(s) were performed by non-physician practitioner and as supervising physician I was immediately available for consultation/collaboration.  Lion Fernandez, M.D.  Alaine Loughney C Katy Brickell, MD 02/26/14 2215 

## 2014-02-26 NOTE — ED Provider Notes (Signed)
CSN: [id number]     Arrival date & time 02/26/14  2002 History   First MD Initiated Contact with Patient 02/26/14 2044     Chief Complaint  Patient presents with  . uti high glucose    HPI  [name] is a 33 y.o. female with a PMH of DM who presents to the ED for evaluation of UTI and hyperglycemia. History was provided by the patient. Patient states that she has had dysuria, urinary frequency, polydipsia, and polyurina for the past two weeks. She denies any abdominal pain, nausea, emesis, constipation, diarrhea, vaginal bleeding/discharge, or genital sores. She also has had vaginal itching. She denies being sexually active. Patient went to the UC for this and was found to have an elevated BS of 400. She was transferred to the ED for further evaluation and management of her hyperglycemia. Patient hs a non-insulin dependent diabetic. She takes metformin twice daily but has not taken her medications for the past few years for financial reasons. She denies any dizziness, lightheadedness, blurry vision, headaches, fatigue, generalized weakness, fever, chills, or other concerns.    Past Medical History  Diagnosis Date  . Diabetes mellitus without complication    History reviewed. No pertinent past surgical history. No family history on file. History  Substance Use Topics  . Smoking status: Never Smoker   . Smokeless tobacco: Not on file  . Alcohol Use: No   OB History   Grav Para Term Preterm Abortions TAB SAB Ect Mult Living                  Review of Systems  Constitutional: Negative for fever, diaphoresis, activity change, appetite change and fatigue.  Gastrointestinal: Negative for nausea, vomiting, abdominal pain, diarrhea and constipation.  Endocrine: Positive for polydipsia and polyuria.  Genitourinary: Positive for dysuria, urgency and frequency. Negative for hematuria, flank pain, decreased urine volume, vaginal bleeding, vaginal discharge, difficulty urinating, genital sores,  vaginal pain, menstrual problem and pelvic pain.  Musculoskeletal: Positive for back pain. Negative for myalgias and neck pain.  Neurological: Negative for dizziness, weakness, light-headedness and headaches.     Allergies  Review of patient's allergies indicates no known allergies.  Home Medications   Prior to Admission medications   Medication Sig Start Date End Date Taking? Authorizing Provider  cephALEXin (KEFLEX) 500 MG capsule Take 1 capsule (500 mg total) by mouth 4 (four) times daily. 11/20/13   Hayden Rasmussenavid Mabe, NP  glyBURIDE (DIABETA) 5 MG tablet Take 1 tablet (5 mg total) by mouth 2 (two) times daily with a meal. 08/04/13   Rodolph BongEvan S Corey, MD  metFORMIN (GLUCOPHAGE) 500 MG tablet Take 1 tablet (500 mg total) by mouth 2 (two) times daily with a meal. 08/04/13   Rodolph BongEvan S Corey, MD  phenazopyridine (PYRIDIUM) 200 MG tablet Take 1 tablet (200 mg total) by mouth 3 (three) times daily. 11/20/13   Hayden Rasmussenavid Mabe, NP   BP 100/71  Pulse 92  Temp(Src) 98.6 F (37 C)  Resp 18  Ht 5' (1.524 m)  Wt 124 lb (56.246 kg)  BMI 24.22 kg/m2  SpO2 100%  LMP 02/07/2014  Filed Vitals:   02/26/14 2345 02/27/14 0000 02/27/14 0015 02/27/14 0041  BP: 121/83 120/83 117/84 120/84  Pulse: 86 84 88 74  Temp:    98.7 F (37.1 C)  TempSrc:    Oral  Resp:    16  Height:      Weight:      SpO2: 99% 100% 100% 100%  Physical Exam  Nursing note and vitals reviewed. Constitutional: She is oriented to person, place, and time. She appears well-developed and well-nourished. No distress.  Non-toxic  HENT:  Head: Normocephalic and atraumatic.  Right Ear: External ear normal.  Left Ear: External ear normal.  Mouth/Throat: Oropharynx is clear and moist.  Eyes: Conjunctivae are normal. Right eye exhibits no discharge. Left eye exhibits no discharge.  Neck: Normal range of motion. Neck supple.  Cardiovascular: Normal rate, regular rhythm and normal heart sounds.  Exam reveals no gallop and no friction rub.   No  murmur heard. Pulmonary/Chest: Effort normal and breath sounds normal. No respiratory distress. She has no wheezes. She has no rales. She exhibits no tenderness.  Abdominal: Soft. Bowel sounds are normal. She exhibits no distension and no mass. There is no tenderness. There is no rebound and no guarding.  Genitourinary:  Mild amount of thick white vaginal discharge in the vaginal vault. No vaginal bleeding. No CMT or adnexal tenderness   Musculoskeletal: Normal range of motion. She exhibits no edema and no tenderness.  No CVA, lumbar, or flank tenderness bilaterally.   Neurological: She is alert and oriented to person, place, and time.  Skin: Skin is warm and dry. She is not diaphoretic.     ED Course  Procedures (including critical care time) Labs Review Labs Reviewed  CBC WITH DIFFERENTIAL - Abnormal; Notable for the following:    WBC 11.6 (*)    Lymphocytes Relative 47 (*)    Lymphs Abs 5.5 (*)    All other components within normal limits  COMPREHENSIVE METABOLIC PANEL    Imaging Review No results found.   EKG Interpretation None      Results for orders placed during the hospital encounter of 02/26/14  WET PREP, GENITAL      Result Value Ref Range   Yeast Wet Prep HPF POC FEW (*) NONE SEEN   Trich, Wet Prep NONE SEEN  NONE SEEN   Clue Cells Wet Prep HPF POC NONE SEEN  NONE SEEN   WBC, Wet Prep HPF POC MODERATE (*) NONE SEEN  CBC WITH DIFFERENTIAL      Result Value Ref Range   WBC 11.6 (*) 4.0 - 10.5 K/uL   RBC 4.95  3.87 - 5.11 MIL/uL   Hemoglobin 14.0  12.0 - 15.0 g/dL   HCT 28.440.8  13.236.0 - 44.046.0 %   MCV 82.4  78.0 - 100.0 fL   MCH 28.3  26.0 - 34.0 pg   MCHC 34.3  30.0 - 36.0 g/dL   RDW 10.212.6  72.511.5 - 36.615.5 %   Platelets 309  150 - 400 K/uL   Neutrophils Relative % 47  43 - 77 %   Neutro Abs 5.4  1.7 - 7.7 K/uL   Lymphocytes Relative 47 (*) 12 - 46 %   Lymphs Abs 5.5 (*) 0.7 - 4.0 K/uL   Monocytes Relative 5  3 - 12 %   Monocytes Absolute 0.5  0.1 - 1.0 K/uL    Eosinophils Relative 1  0 - 5 %   Eosinophils Absolute 0.1  0.0 - 0.7 K/uL   Basophils Relative 0  0 - 1 %   Basophils Absolute 0.0  0.0 - 0.1 K/uL  COMPREHENSIVE METABOLIC PANEL      Result Value Ref Range   Sodium 136 (*) 137 - 147 mEq/L   Potassium 3.9  3.7 - 5.3 mEq/L   Chloride 96  96 - 112 mEq/L   CO2 23  19 - 32 mEq/L   Glucose, Bld 363 (*) 70 - 99 mg/dL   BUN 8  6 - 23 mg/dL   Creatinine, Ser 1.61 (*) 0.50 - 1.10 mg/dL   Calcium 9.6  8.4 - 09.6 mg/dL   Total Protein 8.0  6.0 - 8.3 g/dL   Albumin 3.6  3.5 - 5.2 g/dL   AST 21  0 - 37 U/L   ALT 28  0 - 35 U/L   Alkaline Phosphatase 79  39 - 117 U/L   Total Bilirubin 0.3  0.3 - 1.2 mg/dL   GFR calc non Af Amer >90  >90 mL/min   GFR calc Af Amer >90  >90 mL/min  I-STAT CHEM 8, ED      Result Value Ref Range   Sodium 139  137 - 147 mEq/L   Potassium 3.7  3.7 - 5.3 mEq/L   Chloride 106  96 - 112 mEq/L   BUN 5 (*) 6 - 23 mg/dL   Creatinine, Ser 0.45 (*) 0.50 - 1.10 mg/dL   Glucose, Bld 409 (*) 70 - 99 mg/dL   Calcium, Ion 8.11  1.12 - 1.23 mmol/L   TCO2 24  0 - 100 mmol/L   Hemoglobin 13.3  12.0 - 15.0 g/dL   HCT 91.4  78.2 - 95.6 %    Urine pregnancy - negative  Urine dipstick - positive for glucose (>1000). Negative for ketones, nitrites, or leukocytes    MDM   [name] is a 33 y.o. female with a PMH of DM who presents to the ED for evaluation of UTI and hyperglycemia. Etiology of dysuria possibly due to a vaginal yeast infection. Patient treated with Diflucan in the ED. UA negative for UTI (dip done at [facility]). Pelvic exam unremarkable. No abdominal pain or tenderness. Polydipsia and polyuria likely due to DM. Patient had elevated BS in the ED 363 and AG (17) which reduced with 2L IV fluids (250 and AG 9). No ketonuria. Doubt DKA. Will refill patient's metformin prescription. Patient instructed to follow-up with a PCP and was given resources. Vital signs stable. Return precautions, discharge instructions, and  follow-up was discussed with the patient before discharge.      Discharge Medication List as of 02/27/2014 12:36 AM    START taking these medications   Details  metFORMIN (GLUCOPHAGE) 500 MG tablet Take 1 tablet (500 mg total) by mouth 2 (two) times daily with a meal., Starting 02/27/2014, Until Discontinued, Print         Final impressions: 1. Hyperglycemia   2. Yeast infection       Thomasenia Sales            Jillyn Ledger, [place] 02/27/14 [phone]

## 2014-02-27 LAB — I-STAT CHEM 8, ED
BUN: 5 mg/dL — ABNORMAL LOW (ref 6–23)
CHLORIDE: 106 meq/L (ref 96–112)
Calcium, Ion: 1.2 mmol/L (ref 1.12–1.23)
Creatinine, Ser: 0.4 mg/dL — ABNORMAL LOW (ref 0.50–1.10)
Glucose, Bld: 250 mg/dL — ABNORMAL HIGH (ref 70–99)
HCT: 39 % (ref 36.0–46.0)
HEMOGLOBIN: 13.3 g/dL (ref 12.0–15.0)
POTASSIUM: 3.7 meq/L (ref 3.7–5.3)
SODIUM: 139 meq/L (ref 137–147)
TCO2: 24 mmol/L (ref 0–100)

## 2014-02-27 MED ORDER — METFORMIN HCL 500 MG PO TABS: 500.0000 mg | ORAL_TABLET | Freq: Two times a day (BID) | ORAL | Status: DC

## 2014-02-27 MED ORDER — FLUCONAZOLE 100 MG PO TABS
150.0000 mg | ORAL_TABLET | Freq: Once | ORAL | Status: AC
  Administered 2014-02-27: 150 mg via ORAL
  Filled 2014-02-27: qty 2

## 2014-02-27 NOTE — Discharge Instructions (Signed)
Continue to monitor your blood sugar at home - start taking metformin Drink fluids and rest  Return to the emergency department if you develop any changing/worsening condition, abdominal pain, repeated vomiting, fever, difficulty with urination, or any other concerns (please read additional information regarding your condition below)     Hyperglycemia Hyperglycemia occurs when the glucose (sugar) in your blood is too high. Hyperglycemia can happen for many reasons, but it most often happens to people who do not know they have diabetes or are not managing their diabetes properly.  CAUSES  Whether you have diabetes or not, there are other causes of hyperglycemia. Hyperglycemia can occur when you have diabetes, but it can also occur in other situations that you might not be as aware of, such as: Diabetes  If you have diabetes and are having problems controlling your blood glucose, hyperglycemia could occur because of some of the following reasons:  Not following your meal plan.  Not taking your diabetes medications or not taking it properly.  Exercising less or doing less activity than you normally do.  Being sick. Pre-diabetes  This cannot be ignored. Before people develop Type 2 diabetes, they almost always have "pre-diabetes." This is when your blood glucose levels are higher than normal, but not yet high enough to be diagnosed as diabetes. Research has shown that some long-term damage to the body, especially the heart and circulatory system, may already be occurring during pre-diabetes. If you take action to manage your blood glucose when you have pre-diabetes, you may delay or prevent Type 2 diabetes from developing. Stress  If you have diabetes, you may be "diet" controlled or on oral medications or insulin to control your diabetes. However, you may find that your blood glucose is higher than usual in the hospital whether you have diabetes or not. This is often referred to as "stress  hyperglycemia." Stress can elevate your blood glucose. This happens because of hormones put out by the body during times of stress. If stress has been the cause of your high blood glucose, it can be followed regularly by your caregiver. That way he/she can make sure your hyperglycemia does not continue to get worse or progress to diabetes. Steroids  Steroids are medications that act on the infection fighting system (immune system) to block inflammation or infection. One side effect can be a rise in blood glucose. Most people can produce enough extra insulin to allow for this rise, but for those who cannot, steroids make blood glucose levels go even higher. It is not unusual for steroid treatments to "uncover" diabetes that is developing. It is not always possible to determine if the hyperglycemia will go away after the steroids are stopped. A special blood test called an A1c is sometimes done to determine if your blood glucose was elevated before the steroids were started. SYMPTOMS  Thirsty.  Frequent urination.  Dry mouth.  Blurred vision.  Tired or fatigue.  Weakness.  Sleepy.  Tingling in feet or leg. DIAGNOSIS  Diagnosis is made by monitoring blood glucose in one or all of the following ways:  A1c test. This is a chemical found in your blood.  Fingerstick blood glucose monitoring.  Laboratory results. TREATMENT  First, knowing the cause of the hyperglycemia is important before the hyperglycemia can be treated. Treatment may include, but is not be limited to:  Education.  Change or adjustment in medications.  Change or adjustment in meal plan.  Treatment for an illness, infection, etc.  More frequent blood  glucose monitoring.  Change in exercise plan.  Decreasing or stopping steroids.  Lifestyle changes. HOME CARE INSTRUCTIONS   Test your blood glucose as directed.  Exercise regularly. Your caregiver will give you instructions about exercise. Pre-diabetes or  diabetes which comes on with stress is helped by exercising.  Eat wholesome, balanced meals. Eat often and at regular, fixed times. Your caregiver or nutritionist will give you a meal plan to guide your sugar intake.  Being at an ideal weight is important. If needed, losing as little as 10 to 15 pounds may help improve blood glucose levels. SEEK MEDICAL CARE IF:   You have questions about medicine, activity, or diet.  You continue to have symptoms (problems such as increased thirst, urination, or weight gain). SEEK IMMEDIATE MEDICAL CARE IF:   You are vomiting or have diarrhea.  Your breath smells fruity.  You are breathing faster or slower.  You are very sleepy or incoherent.  You have numbness, tingling, or pain in your feet or hands.  You have chest pain.  Your symptoms get worse even though you have been following your caregiver's orders.  If you have any other questions or concerns. Document Released: 02/26/2001 Document Revised: 11/25/2011 Document Reviewed: 12/30/2011 [facility] Patient Information 2014 [place], [place].  Type 2 Diabetes Mellitus, Adult Type 2 diabetes mellitus, often simply referred to as type 2 diabetes, is a long-lasting (chronic) disease. In type 2 diabetes, the pancreas does not make enough insulin (a hormone), the cells are less responsive to the insulin that is made (insulin resistance), or both. Normally, insulin moves sugars from food into the tissue cells. The tissue cells use the sugars for energy. The lack of insulin or the lack of normal response to insulin causes excess sugars to build up in the blood instead of going into the tissue cells. As a result, high blood sugar (hyperglycemia) develops. The effect of high sugar (glucose) levels can cause many complications. Type 2 diabetes was also previously called adult-onset diabetes but it can occur at any age.  RISK FACTORS  A person is predisposed to developing type 2 diabetes if someone in the  family has the disease and also has one or more of the following primary risk factors:  Overweight.  An inactive lifestyle.  A history of consistently eating high-calorie foods. Maintaining a normal weight and regular physical activity can reduce the chance of developing type 2 diabetes. SYMPTOMS  A person with type 2 diabetes may not show symptoms initially. The symptoms of type 2 diabetes appear slowly. The symptoms include:  Increased thirst (polydipsia).  Increased urination (polyuria).  Increased urination during the night (nocturia).  Weight loss. This weight loss may be rapid.  Frequent, recurring infections.  Tiredness (fatigue).  Weakness.  Vision changes, such as blurred vision.  Fruity smell to your breath.  Abdominal pain.  Nausea or vomiting.  Cuts or bruises which are slow to heal.  Tingling or numbness in the hands or feet. DIAGNOSIS Type 2 diabetes is frequently not diagnosed until complications of diabetes are present. Type 2 diabetes is diagnosed when symptoms or complications are present and when blood glucose levels are increased. Your blood glucose level may be checked by one or more of the following blood tests:  A fasting blood glucose test. You will not be allowed to eat for at least 8 hours before a blood sample is taken.  A random blood glucose test. Your blood glucose is checked at any time of the day regardless of  when you ate.  A hemoglobin A1c blood glucose test. A hemoglobin A1c test provides information about blood glucose control over the previous 3 months.  An oral glucose tolerance test (OGTT). Your blood glucose is measured after you have not eaten (fasted) for 2 hours and then after you drink a glucose-containing beverage. TREATMENT   You may need to take insulin or diabetes medicine daily to keep blood glucose levels in the desired range.  You will need to match insulin dosing with exercise and healthy food choices. The  treatment goal is to maintain the before meal blood sugar (preprandial glucose) level at 70 130 mg/dL. HOME CARE INSTRUCTIONS   Have your hemoglobin A1c level checked twice a year.  Perform daily blood glucose monitoring as directed by your caregiver.  Monitor urine ketones when you are ill and as directed by your caregiver.  Take your diabetes medicine or insulin as directed by your caregiver to maintain your blood glucose levels in the desired range.  Never run out of diabetes medicine or insulin. It is needed every day.  Adjust insulin based on your intake of carbohydrates. Carbohydrates can raise blood glucose levels but need to be included in your diet. Carbohydrates provide vitamins, minerals, and fiber which are an essential part of a healthy diet. Carbohydrates are found in fruits, vegetables, whole grains, dairy products, legumes, and foods containing added sugars.    Eat healthy foods. Alternate 3 meals with 3 snacks.  Lose weight if overweight.  Carry a medical alert card or wear your medical alert jewelry.  Carry a 15 gram carbohydrate snack with you at all times to treat low blood glucose (hypoglycemia). Some examples of 15 gram carbohydrate snacks include:  Glucose tablets, 3 or 4   Glucose gel, 15 gram tube  Raisins, 2 tablespoons (24 grams)  Jelly beans, 6  Animal crackers, 8  Regular pop, 4 ounces (120 mL)  Gummy treats, 9  Recognize hypoglycemia. Hypoglycemia occurs with blood glucose levels of 70 mg/dL and below. The risk for hypoglycemia increases when fasting or skipping meals, during or after intense exercise, and during sleep. Hypoglycemia symptoms can include:  Tremors or shakes.  Decreased ability to concentrate.  Sweating.  Increased heart rate.  Headache.  Dry mouth.  Hunger.  Irritability.  Anxiety.  Restless sleep.  Altered speech or coordination.  Confusion.  Treat hypoglycemia promptly. If you are alert and able to  safely swallow, follow the 15:15 rule:  Take 15 20 grams of rapid-acting glucose or carbohydrate. Rapid-acting options include glucose gel, glucose tablets, or 4 ounces (120 mL) of fruit juice, regular soda, or low fat milk.  Check your blood glucose level 15 minutes after taking the glucose.  Take 15 20 grams more of glucose if the repeat blood glucose level is still 70 mg/dL or below.  Eat a meal or snack within 1 hour once blood glucose levels return to normal.    Be alert to polyuria and polydipsia which are early signs of hyperglycemia. An early awareness of hyperglycemia allows for prompt treatment. Treat hyperglycemia as directed by your caregiver.  Engage in at least 150 minutes of moderate-intensity physical activity a week, spread over at least 3 days of the week or as directed by your caregiver. In addition, you should engage in resistance exercise at least 2 times a week or as directed by your caregiver.  Adjust your medicine and food intake as needed if you start a new exercise or sport.  Follow your sick  day plan at any time you are unable to eat or drink as usual.  Avoid tobacco use.  Limit alcohol intake to no more than 1 drink per day for nonpregnant women and 2 drinks per day for men. You should drink alcohol only when you are also eating food. Talk with your caregiver whether alcohol is safe for you. Tell your caregiver if you drink alcohol several times a week.  Follow up with your caregiver regularly.  Schedule an eye exam soon after the diagnosis of type 2 diabetes and then annually.  Perform daily skin and foot care. Examine your skin and feet daily for cuts, bruises, redness, nail problems, bleeding, blisters, or sores. A foot exam by a caregiver should be done annually.  Brush your teeth and gums at least twice a day and floss at least once a day. Follow up with your dentist regularly.  Share your diabetes management plan with your workplace or  school.  Stay up-to-date with immunizations.  Learn to manage stress.  Obtain ongoing diabetes education and support as needed.  Participate in, or seek rehabilitation as needed to maintain or improve independence and quality of life. Request a physical or occupational therapy referral if you are having foot or hand numbness or difficulties with grooming, dressing, eating, or physical activity. SEEK MEDICAL CARE IF:   You are unable to eat food or drink fluids for more than 6 hours.  You have nausea and vomiting for more than 6 hours.  Your blood glucose level is over 240 mg/dL.  There is a change in mental status.  You develop an additional serious illness.  You have diarrhea for more than 6 hours.  You have been sick or have had a fever for a couple of days and are not getting better.  You have pain during any physical activity.  SEEK IMMEDIATE MEDICAL CARE IF:  You have difficulty breathing.  You have moderate to large ketone levels. MAKE SURE YOU:  Understand these instructions.  Will watch your condition.  Will get help right away if you are not doing well or get worse. Document Released: 09/02/2005 Document Revised: 05/27/2012 Document Reviewed: 03/31/2012 [facility] Patient Information 2014 [place], [place].  Monilial Vaginitis Vaginitis in a soreness, swelling and redness (inflammation) of the vagina and vulva. Monilial vaginitis is not a sexually transmitted infection. CAUSES  Yeast vaginitis is caused by yeast (candida) that is normally found in your vagina. With a yeast infection, the candida has overgrown in number to a point that upsets the chemical balance. SYMPTOMS  White, thick vaginal discharge. Swelling, itching, redness and irritation of the vagina and possibly the lips of the vagina (vulva). Burning or painful urination. Painful intercourse. DIAGNOSIS  Things that may contribute to monilial vaginitis are: Postmenopausal and virginal  states. Pregnancy. Infections. Being tired, sick or stressed, especially if you had monilial vaginitis in the past. Diabetes. Good control will help lower the chance. Birth control pills. Tight fitting garments. Using bubble bath, feminine sprays, douches or deodorant tampons. Taking certain medications that kill germs (antibiotics). Sporadic recurrence can occur if you become ill. TREATMENT  Your caregiver will give you medication. There are several kinds of anti monilial vaginal creams and suppositories specific for monilial vaginitis. For recurrent yeast infections, use a suppository or cream in the vagina 2 times a week, or as directed. Anti-monilial or steroid cream for the itching or irritation of the vulva may also be used. Get your caregiver's permission. Painting the vagina with methylene blue  solution may help if the monilial cream does not work. Eating yogurt may help prevent monilial vaginitis. HOME CARE INSTRUCTIONS  Finish all medication as prescribed. Do not have sex until treatment is completed or after your caregiver tells you it is okay. Take warm sitz baths. Do not douche. Do not use tampons, especially scented ones. Wear cotton underwear. Avoid tight pants and panty hose. Tell your sexual partner that you have a yeast infection. They should go to their caregiver if they have symptoms such as mild rash or itching. Your sexual partner should be treated as well if your infection is difficult to eliminate. Practice safer sex. Use condoms. Some vaginal medications cause latex condoms to fail. Vaginal medications that harm condoms are: Cleocin cream. Butoconazole (Femstat). Terconazole (Terazol) vaginal suppository. Miconazole (Monistat) (may be purchased over the counter). SEEK MEDICAL CARE IF:  You have a temperature by mouth above 102 F (38.9 C). The infection is getting worse after 2 days of treatment. The infection is not getting better after 3 days of  treatment. You develop blisters in or around your vagina. You develop vaginal bleeding, and it is not your menstrual period. You have pain when you urinate. You develop intestinal problems. You have pain with sexual intercourse. Document Released: 06/12/2005 Document Revised: 11/25/2011 Document Reviewed: 02/24/2009 [facility] Patient Information 2014 [place], [place].   Emergency Department Resource Guide 1) Find a Doctor and Pay Out of Pocket Although you won't have to find out who is covered by your insurance plan, it is a good idea to ask around and get recommendations. You will then need to call the office and see if the doctor you have chosen will accept you as a new patient and what types of options they offer for patients who are self-pay. Some doctors offer discounts or will set up payment plans for their patients who do not have insurance, but you will need to ask so you aren't surprised when you get to your appointment.  2) Contact Your Local Health Department Not all health departments have doctors that can see patients for sick visits, but many do, so it is worth a call to see if yours does. If you don't know where your local health department is, you can check in your phone book. The CDC also has a tool to help you locate your state's health department, and many state websites also have listings of all of their local health departments.  3) Find a Walk-in Clinic If your illness is not likely to be very severe or complicated, you may want to try a walk in clinic. These are popping up all over the country in pharmacies, drugstores, and shopping centers. They're usually staffed by nurse practitioners or physician assistants that have been trained to treat common illnesses and complaints. They're usually fairly quick and inexpensive. However, if you have serious medical issues or chronic medical problems, these are probably not your best option.  No Primary Care Doctor: - Call Health  Connect at  [phone] - they can help you locate a primary care doctor that  accepts your insurance, provides certain services, etc. - Physician Referral Service- [phone]  Chronic Pain Problems: Organization         Address  Phone   Notes  [facility]  [phone] Patients need to be referred by their primary care doctor.   Medication Assistance: Organization         Address  Phone   Notes  [facility]ication  [facility]istance Program [address] [place] [place], [place] [address] [phone] --Must be a resident of [facility] -- Must have NO insurance coverage whatsoever (no Medicaid/ Medicare, etc.) -- The pt. MUST have a primary care doctor that directs their care regularly and follows them in the community   [facility]  [phone]   [employer]  [phone]    Agencies that provide inexpensive medical care: Organization         Address  Phone   Notes  [facility]  [phone]   [facility]    [phone]   [facility] [address] [place], [place] [address] [phone]   [facility] [address] [place] [phone]   [facility]anned Parenthood    [phone]   [facility]    [phone]   [facility] and [facility]  [address], [place] Phone:  [phone], Fax:  [phone] Hours of Operation:  9 am - 6 pm, M-F.  Also accepts Medicaid/Medicare and self-pay.  [facility]  [address], [place] Phone: [phone], Fax: [phone]. Hours of Operation:  8:30 am - 5:30 pm, M-F.  Also accepts Medicaid and self-pay.  [facility] [place] [address] [place] Phone: [phone]   [facility] [address] Natasha Bence [place], [place] [phone], Ext. [phone] Mondays & Thursdays: 7-9 AM.  First 15 patients are seen on a first come, first serve basis.     Medicaid-accepting [facility] Providers:  Organization         Address  Phone   Notes  [facility] [address],  [phone] Also accepts self-pay patients.  [facility] [address] Laurell Josephs [place], [place]  [phone]   [facility] [address], [place] [phone]   [facility] [address], [place] [phone]   Renaye Rakers [address], [place]   [phone] Only accepts [place] patients after they have their name applied to their card.   Self-Pay (no insurance) in [facility]:  Organization         Address  Phone   Notes  Sickle Cell Patients, [facility] [address] [place], [place] [phone]   [facility] [address] [place], [place] [phone]   [facility] [place]  [address], [place] [phone]   [facility]/Dr. Osei-Bonsu  [address], [place] or [address], [place] [phone] Phone number for both [place] and [place] locations is the same.  [facility] [address], [place] [phone]   [facility] [address] [place] or [address] [phone]   [facility] [address], [place] [phone], phone; [phone], fax Sees patients 1st and 3rd Saturday of every month.  Must not qualify for public or private insurance (i.e. Medicaid, Medicare, [place] Health Choice, Veterans' Benefits)  Household income should be no more than 200% of the poverty level The clinic cannot treat you if you are pregnant or think you are pregnant  Sexually transmitted diseases are not treated at the  clinic.    Dental Care: Organization         Address  Phone  Notes  [facility]  Department of [facility] [address] [place], [place] [phone] Accepts children up to age 45 who are enrolled in [place] or [place] Health Choice; pregnant women with a Medicaid card; and children who have applied for Medicaid or [place] Health Choice, but were declined, whose parents can pay a reduced fee at time of service.  [facility] Department of [facility]  [address], [place] [phone] Accepts children up to age 54 who are enrolled in [place] or [place] Health Choice; pregnant women with a Medicaid card; and children who have applied for Medicaid or [place] Health Choice, but were declined, whose parents can pay a reduced fee at time of service.  [facility]  [address] [place], [place] [phone] Patients are seen by appointment only. Walk-ins are not accepted. [facility] will see patients 68 years of age and older. Monday - Tuesday (8am-5pm) Most Wednesdays (8:30-5pm) $30 per visit, cash only  [facility]  [address], [facility] [phone] Patients are seen by appointment only. Walk-ins are not accepted. [facility] will see patients 53 years of age and older. One Wednesday Evening (Monthly: Volunteer Based).  $30 per visit, cash only  [employer]  [phone] for adults; Children under age 6, call Graduate Pediatric Dentistry at [phone]. Children aged 78-14, please call [phone] to request a pediatric application.  Dental services are provided in all areas of dental care including fillings, crowns and bridges, complete and partial dentures, implants, gum treatment, root canals, and extractions. Preventive care is also provided. Treatment is provided to both adults and children. Patients are selected via a lottery and there is often a waiting list.   [facility] [address] [place]  [phone]  [email or web address]   [facility] [address] [place], [place] [phone], Ext. [phone] Second and Fourth Thursday of each month, opens at 6:30 AM; Clinic ends at 9 AM.  Patients are seen on a first-come first-served basis, and a limited number are seen during each clinic.   [facility]  [address] Ether Griffins [place], [place] [phone]   Eligibility Requirements You must have lived in [place], [place], or [place] counties for at least the last three months.   You cannot be eligible for state or federal sponsored [place], including [employer], [place], or [employer].   You generally cannot be eligible for healthcare insurance through your employer.    How to apply: Eligibility screenings are held every Tuesday and Wednesday afternoon from 1:00 pm until 4:00 pm. You do not need an appointment for the interview!  [facility] [address], [place], [place] [phone]   [facility] Health Department  [phone]   [facility] Health Department  [phone]   [facility] Health Department  [phone]    Behavioral Health Resources in the Community: Intensive Outpatient Programs Organization         Address  Phone  Notes  [facility] [address], [place], [place] [phone]   [facility] [address] [place], [place] [phone]   [facility] [address] [place], [place]  [phone]   [facility]  [address],  [place], [place] [phone] or [phone]   Substance Abuse Resources Organization         Address  Phone  Notes  [facility]  [phone]   [facility]  [phone]   [facility]  [phone]   [facility]  [phone]   Residential & Outpatient Substance Abuse Program  [phone]   Psychological Services Organization          Address  Phone  Notes  [facility]  [phone]   [facility]  [phone]- [phone]   [facility] [address], [place] [phone] or [phone]    Mobile Crisis Teams Organization         Address  Phone  Notes  [facility], Mobile Crisis Care Unit  [phone]   [facility]  [address] [place], [place] [phone]   Doristine LocksSharon DeEsch [address] [place] [phone]    Self-Help/Support Groups Organization         Address  Phone             Notes  [facility] [place] - variety of support groups  336- [phone] Call for more information  Narcotics Anonymous (NA), [facility] [address], [place]  2 meetings at this location   Statisticianesidential Treatment Programs Organization         Address  Phone  Notes  [facility] [address],    [place]  [phone]   [facility]  [address], [place] [phone], [place], [place] [phone]   [facility] [address], [place] [phone] Admissions: 8am-3pm M-F  [facility] [address]    [place], [place] [phone]   [facility] [address], [place], [place] [phone]   [facility] [address]  [place], [place] [phone]   [facility] - Intensive Outpatient [address], [place], [place] [phone]   [facility] ([facility] [address]  [place], [place] [phone] or [phone]   [facility] ([facility]) [address] [place], [place] [phone] Accepts Medicaid  [facility] [address]  [place] [phone] Substance Abuse/Addiction Treatment   [facility] Resources Organization         Address  Phone  Notes  [facility]  [phone]   Angie FavaJulie Brannon, PhD [address], [address] [place], [place]   [phone] or [phone]) [phone]   [facility]   [address] [place], [place] [phone](336) [phone]   [facility] [address], [place], [place] [phone](336) [phone] Insurance/Medicaid/sponsorship through [facility]  [facility] [address]                                    [place], [place] [phone]) [phone] Therapy/tele-psych/case  [facility] [address]   [place], [place] [phone](336) [phone]    Dr. Lolly MustacheArfeen  [phone]   [facility]  [facility] 1) [address], [place] 2) [address], [place] 3)  [address], [place] [phone]) [phone]  [phone](336) [phone]   [facility] Child Abuse Hotline [phone]) 9715677896 or [phone]) [phone] (After Hours)

## 2014-02-28 LAB — GC/CHLAMYDIA PROBE AMP
CT PROBE, AMP APTIMA: NEGATIVE
GC PROBE AMP APTIMA: NEGATIVE

## 2014-03-03 NOTE — ED Provider Notes (Signed)
Medical screening examination/treatment/procedure(s) were performed by non-physician practitioner and as supervising physician I was immediately available for consultation/collaboration.   EKG Interpretation None         Richardean Canalavid H Yao, MD 03/03/14 2258

## 2014-03-10 ENCOUNTER — Encounter (HOSPITAL_COMMUNITY): Payer: Self-pay | Admitting: Emergency Medicine

## 2014-03-10 ENCOUNTER — Emergency Department (HOSPITAL_COMMUNITY)
Admission: EM | Admit: 2014-03-10 | Discharge: 2014-03-11 | Disposition: A | Discharge status: Home / Self Care | Payer: Medicaid Other | Attending: Emergency Medicine | Admitting: Emergency Medicine

## 2014-03-10 DIAGNOSIS — E119 Type 2 diabetes mellitus without complications: Secondary | ICD-10-CM | POA: Insufficient documentation

## 2014-03-10 DIAGNOSIS — Z79899 Other long term (current) drug therapy: Secondary | ICD-10-CM | POA: Insufficient documentation

## 2014-03-10 DIAGNOSIS — Z792 Long term (current) use of antibiotics: Secondary | ICD-10-CM | POA: Insufficient documentation

## 2014-03-10 DIAGNOSIS — Z3202 Encounter for pregnancy test, result negative: Secondary | ICD-10-CM | POA: Insufficient documentation

## 2014-03-10 DIAGNOSIS — N390 Urinary tract infection, site not specified: Secondary | ICD-10-CM | POA: Insufficient documentation

## 2014-03-10 LAB — URINALYSIS, ROUTINE W REFLEX MICROSCOPIC
Bilirubin Urine: NEGATIVE
Glucose, UA: 100 mg/dL — AB
KETONES UR: NEGATIVE mg/dL
Nitrite: NEGATIVE
PROTEIN: NEGATIVE mg/dL
Specific Gravity, Urine: 1.029 (ref 1.005–1.030)
UROBILINOGEN UA: 0.2 mg/dL (ref 0.0–1.0)
pH: 5 (ref 5.0–8.0)

## 2014-03-10 LAB — URINE MICROSCOPIC-ADD ON

## 2014-03-10 LAB — PREGNANCY, URINE: Preg Test, Ur: NEGATIVE

## 2014-03-10 NOTE — ED Notes (Signed)
Pt. reports dysuria/urinary hesitation/oliguria with bladder pressure for 2 weeks , denies fever or chills.

## 2014-03-11 LAB — CBC
HCT: 39 % (ref 36.0–46.0)
HEMOGLOBIN: 13.3 g/dL (ref 12.0–15.0)
MCH: 28.4 pg (ref 26.0–34.0)
MCHC: 34.1 g/dL (ref 30.0–36.0)
MCV: 83.2 fL (ref 78.0–100.0)
Platelets: 310 10*3/uL (ref 150–400)
RBC: 4.69 MIL/uL (ref 3.87–5.11)
RDW: 12.9 % (ref 11.5–15.5)
WBC: 12.5 10*3/uL — ABNORMAL HIGH (ref 4.0–10.5)

## 2014-03-11 LAB — I-STAT CHEM 8, ED
BUN: 14 mg/dL (ref 6–23)
Calcium, Ion: 1.28 mmol/L — ABNORMAL HIGH (ref 1.12–1.23)
Chloride: 102 mEq/L (ref 96–112)
Creatinine, Ser: 0.4 mg/dL — ABNORMAL LOW (ref 0.50–1.10)
GLUCOSE: 155 mg/dL — AB (ref 70–99)
HCT: 43 % (ref 36.0–46.0)
HEMOGLOBIN: 14.6 g/dL (ref 12.0–15.0)
Potassium: 3.8 mEq/L (ref 3.7–5.3)
SODIUM: 140 meq/L (ref 137–147)
TCO2: 23 mmol/L (ref 0–100)

## 2014-03-11 LAB — CBG MONITORING, ED: Glucose-Capillary: 148 mg/dL — ABNORMAL HIGH (ref 70–99)

## 2014-03-11 LAB — LIPASE, BLOOD: Lipase: 43 U/L (ref 11–59)

## 2014-03-11 MED ORDER — CEPHALEXIN 500 MG PO CAPS: 500.0000 mg | ORAL_CAPSULE | Freq: Three times a day (TID) | ORAL | Status: DC

## 2014-03-11 NOTE — ED Provider Notes (Signed)
CSN: [id number]     Arrival date & time 03/10/14  2135 History   First MD Initiated Contact with Patient 03/11/14 0040     Chief Complaint  Patient presents with  . Dysuria     (Consider location/radiation/quality/duration/timing/severity/associated sxs/prior Treatment) The history is provided by the patient. No language interpreter was used.  [name] is a 33 year old female with past medical history of diabetes presenting to the ED with urinary hesitation and dysuria does been ongoing for the past 2 weeks. As per daughter, reported that when she urinates she is a burning sensation. Stated that she's been having mild suprapubic discomfort describes the achiness that radiates towards her back especially when urinating. Denied fever, nausea, vomiting, chills, vaginal discharge, vaginal pain, hematuria, changes to bowel movements, melena, hematochezia, chest pain, shortness of breath, difficulty breathing. PCP none  Past Medical History  Diagnosis Date  . Diabetes mellitus without complication    History reviewed. No pertinent past surgical history. No family history on file. History  Substance Use Topics  . Smoking status: Never Smoker   . Smokeless tobacco: Not on file  . Alcohol Use: No   OB History   Grav Para Term Preterm Abortions TAB SAB Ect Mult Living                 Review of Systems  Constitutional: Negative for fever and chills.  Respiratory: Negative for chest tightness and shortness of breath.   Cardiovascular: Negative for chest pain.  Gastrointestinal: Positive for abdominal pain. Negative for nausea, vomiting, diarrhea, constipation, blood in stool and anal bleeding.  Genitourinary: Positive for dysuria and decreased urine volume. Negative for hematuria, flank pain, vaginal bleeding, vaginal discharge, vaginal pain and pelvic pain.  Musculoskeletal: Positive for back pain. Negative for neck pain.  Neurological: Negative for weakness and headaches.       Allergies  Review of patient's allergies indicates no known allergies.  Home Medications   Prior to Admission medications   Medication Sig Start Date End Date Taking? Authorizing Provider  metFORMIN (GLUCOPHAGE) 500 MG tablet Take 1 tablet (500 mg total) by mouth 2 (two) times daily with a meal. 02/27/14  Yes Jillyn LedgerJessica K Palmer, PA-C  cephALEXin (KEFLEX) 500 MG capsule Take 1 capsule (500 mg total) by mouth 3 (three) times daily. 03/11/14   Marissa Sciacca, PA-C   BP 122/79  Pulse 98  Temp(Src) 98.8 F (37.1 C) (Oral)  Resp 14  Ht 5\' 1"  (1.549 m)  Wt 124 lb (56.246 kg)  BMI 23.44 kg/m2  SpO2 100%  LMP 02/07/2014 Physical Exam  Nursing note and vitals reviewed. Constitutional: She is oriented to person, place, and time. She appears well-developed and well-nourished. No distress.  HENT:  Head: Normocephalic and atraumatic.  Mouth/Throat: Oropharynx is clear and moist. No oropharyngeal exudate.  Eyes: Conjunctivae and EOM are normal. Pupils are equal, round, and reactive to light. Right eye exhibits no discharge. Left eye exhibits no discharge.  Neck: Normal range of motion. Neck supple. No tracheal deviation present.  Cardiovascular: Normal rate, regular rhythm and normal heart sounds.  Exam reveals no friction rub.   No murmur heard. Pulmonary/Chest: Effort normal and breath sounds normal. No respiratory distress. She has no wheezes. She has no rales.  Abdominal: Soft. Bowel sounds are normal. She exhibits no distension. There is tenderness. There is no rebound and no guarding.  Negative abdominal distention Bowel sounds normoactive in all 4 quadrants Abdomen soft upon palpation Mild discomfort upon suprapubic region  Negative  rigidity or guarding noted Negative bilateral CVA tenderness  Musculoskeletal: Normal range of motion.  Full ROM to upper and lower extremities without difficulty noted, negative ataxia noted.  Lymphadenopathy:    She has no cervical adenopathy.   Neurological: She is alert and oriented to person, place, and time. No cranial nerve deficit. She exhibits normal muscle tone. Coordination normal.  Skin: Skin is warm and dry. No rash noted. She is not diaphoretic. No erythema.  Psychiatric: She has a normal mood and affect. Her behavior is normal. Thought content normal.    ED Course  Procedures (including critical care time)  Results for orders placed during the hospital encounter of 03/10/14  URINALYSIS, ROUTINE W REFLEX MICROSCOPIC      Result Value Ref Range   Color, Urine YELLOW  YELLOW   APPearance CLOUDY (*) CLEAR   Specific Gravity, Urine 1.029  1.005 - 1.030   pH 5.0  5.0 - 8.0   Glucose, UA 100 (*) NEGATIVE mg/dL   Hgb urine dipstick SMALL (*) NEGATIVE   Bilirubin Urine NEGATIVE  NEGATIVE   Ketones, ur NEGATIVE  NEGATIVE mg/dL   Protein, ur NEGATIVE  NEGATIVE mg/dL   Urobilinogen, UA 0.2  0.0 - 1.0 mg/dL   Nitrite NEGATIVE  NEGATIVE   Leukocytes, UA SMALL (*) NEGATIVE  PREGNANCY, URINE      Result Value Ref Range   Preg Test, Ur NEGATIVE  NEGATIVE  URINE MICROSCOPIC-ADD ON      Result Value Ref Range   Squamous Epithelial / LPF FEW (*) RARE   WBC, UA 7-10  <3 WBC/hpf   RBC / HPF 0-2  <3 RBC/hpf   Bacteria, UA FEW (*) RARE   Casts HYALINE CASTS (*) NEGATIVE  CBC      Result Value Ref Range   WBC 12.5 (*) 4.0 - 10.5 K/uL   RBC 4.69  3.87 - 5.11 MIL/uL   Hemoglobin 13.3  12.0 - 15.0 g/dL   HCT 21.3  08.6 - 57.8 %   MCV 83.2  78.0 - 100.0 fL   MCH 28.4  26.0 - 34.0 pg   MCHC 34.1  30.0 - 36.0 g/dL   RDW 46.9  62.9 - 52.8 %   Platelets 310  150 - 400 K/uL  LIPASE, BLOOD      Result Value Ref Range   Lipase 43  11 - 59 U/L  I-STAT CHEM 8, ED      Result Value Ref Range   Sodium 140  137 - 147 mEq/L   Potassium 3.8  3.7 - 5.3 mEq/L   Chloride 102  96 - 112 mEq/L   BUN 14  6 - 23 mg/dL   Creatinine, Ser 4.13 (*) 0.50 - 1.10 mg/dL   Glucose, Bld 244 (*) 70 - 99 mg/dL   Calcium, Ion 0.10 (*) 1.12 - 1.23  mmol/L   TCO2 23  0 - 100 mmol/L   Hemoglobin 14.6  12.0 - 15.0 g/dL   HCT 27.2  53.6 - 64.4 %  CBG MONITORING, ED      Result Value Ref Range   Glucose-Capillary 148 (*) 70 - 99 mg/dL   Comment 1 Notify RN      Labs Review Labs Reviewed  URINALYSIS, ROUTINE W REFLEX MICROSCOPIC - Abnormal; Notable for the following:    APPearance CLOUDY (*)    Glucose, UA 100 (*)    Hgb urine dipstick SMALL (*)    Leukocytes, UA SMALL (*)    All other  components within normal limits  URINE MICROSCOPIC-ADD ON - Abnormal; Notable for the following:    Squamous Epithelial / LPF FEW (*)    Bacteria, UA FEW (*)    Casts HYALINE CASTS (*)    All other components within normal limits  CBC - Abnormal; Notable for the following:    WBC 12.5 (*)    All other components within normal limits  I-STAT CHEM 8, ED - Abnormal; Notable for the following:    Creatinine, Ser 0.40 (*)    Glucose, Bld 155 (*)    Calcium, Ion 1.28 (*)    All other components within normal limits  CBG MONITORING, ED - Abnormal; Notable for the following:    Glucose-Capillary 148 (*)    All other components within normal limits  URINE CULTURE  PREGNANCY, URINE  LIPASE, BLOOD    Imaging Review No results found.   EKG Interpretation None      MDM   Final diagnoses:  UTI (lower urinary tract infection)    Filed Vitals:   03/10/14 2210  BP: 122/79  Pulse: 98  Temp: 98.8 F (37.1 C)  TempSrc: Oral  Resp: 14  Height: 5\' 1"  (1.549 m)  Weight: 124 lb (56.246 kg)  SpO2: 100%   CBC noted mildly elevated white blood cell count of 12.5. Chem 8 noted mildly low creatinine 0.40. Anion gap of 15.0 mEq per liter with a glucose level of 155. Lipase negative elevation. Urinalysis noted small hemoglobin with small leukocytes with white blood cells 7-10. Urine pregnancy negative. Discussed case with attending physician, Dr. Jenean Lindau. Otter who recommended patient to be discharged with antibiotics for UTI.  Doubt DKA. Doubt  pancreatitis. Doubt appendicitis Negative signs of acute abdominal processes-abdomen soft nontender. Nonsurgical abdomen noted upon exam. Doubt pyelonephritis. Patient presenting to the ED with UTI. Patient not septic appearing. Patient stable, afebrile. Discharged patient. Discharge patient with antibiotics. Discussed with patient to rest and stay hydrated to drink plenty of fluids. Referred to health and wellness Center. Discussed with patient to closely monitor symptoms and if symptoms are to worsen or change to report back to the ED - strict return instructions given.  Patient agreed to plan of care, understood, all questions answered.   [employer] CorporationMarissa Sciacca, PA-C 03/11/14 [phone]

## 2014-03-11 NOTE — Discharge Instructions (Signed)
Please call your doctor for a followup appointment within 24-48 hours. When you talk to your doctor please let them know that you were seen in the emergency department and have them acquire all of your records so that they can discuss the findings with you and formulate a treatment plan to fully care for your new and ongoing problems. Please call and set-up an appointment with your primary care provider Please rest and stay hydrated Please drink plenty of water Please take antibiotics as prescribed Please continue to monitor symptoms closely and if symptoms are to worsen or change (fever greater than 101, chills, sweating, nausea, vomiting, chest pain, shortness of breath, difficulty breathing, numbness, tingling, stomach pain, blood in the urine, black tarry stools, blood in the stools) please report back to the ED immediately  Urinary Tract Infection Urinary tract infections (UTIs) can develop anywhere along your urinary tract. Your urinary tract is your body's drainage system for removing wastes and extra water. Your urinary tract includes two kidneys, two ureters, a bladder, and a urethra. Your kidneys are a pair of bean-shaped organs. Each kidney is about the size of your fist. They are located below your ribs, one on each side of your spine. CAUSES Infections are caused by microbes, which are microscopic organisms, including fungi, viruses, and bacteria. These organisms are so small that they can only be seen through a microscope. Bacteria are the microbes that most commonly cause UTIs. SYMPTOMS  Symptoms of UTIs may vary by age and gender of the patient and by the location of the infection. Symptoms in young women typically include a frequent and intense urge to urinate and a painful, burning feeling in the bladder or urethra during urination. Older women and men are more likely to be tired, shaky, and weak and have muscle aches and abdominal pain. A fever may mean the infection is in your  kidneys. Other symptoms of a kidney infection include pain in your back or sides below the ribs, nausea, and vomiting. DIAGNOSIS To diagnose a UTI, your caregiver will ask you about your symptoms. Your caregiver also will ask to provide a urine sample. The urine sample will be tested for bacteria and white blood cells. White blood cells are made by your body to help fight infection. TREATMENT  Typically, UTIs can be treated with medication. Because most UTIs are caused by a bacterial infection, they usually can be treated with the use of antibiotics. The choice of antibiotic and length of treatment depend on your symptoms and the type of bacteria causing your infection. HOME CARE INSTRUCTIONS  If you were prescribed antibiotics, take them exactly as your caregiver instructs you. Finish the medication even if you feel better after you have only taken some of the medication.  Drink enough water and fluids to keep your urine clear or pale yellow.  Avoid caffeine, tea, and carbonated beverages. They tend to irritate your bladder.  Empty your bladder often. Avoid holding urine for long periods of time.  Empty your bladder before and after sexual intercourse.  After a bowel movement, women should cleanse from front to back. Use each tissue only once. SEEK MEDICAL CARE IF:   You have back pain.  You develop a fever.  Your symptoms do not begin to resolve within 3 days. SEEK IMMEDIATE MEDICAL CARE IF:   You have severe back pain or lower abdominal pain.  You develop chills.  You have nausea or vomiting.  You have continued burning or discomfort with urination. MAKE  SURE YOU:   Understand these instructions.  Will watch your condition.  Will get help right away if you are not doing well or get worse. Document Released: 06/12/2005 Document Revised: 03/03/2012 Document Reviewed: 10/11/2011 [facility] Patient Information 2015 [place], [place]. This information is not intended to replace  advice given to you by your health care provider. Make sure you discuss any questions you have with your health care provider.   Emergency Department Resource Guide 1) Find a Doctor and Pay Out of Pocket Although you won't have to find out who is covered by your insurance plan, it is a good idea to ask around and get recommendations. You will then need to call the office and see if the doctor you have chosen will accept you as a new patient and what types of options they offer for patients who are self-pay. Some doctors offer discounts or will set up payment plans for their patients who do not have insurance, but you will need to ask so you aren't surprised when you get to your appointment.  2) Contact Your Local Health Department Not all health departments have doctors that can see patients for sick visits, but many do, so it is worth a call to see if yours does. If you don't know where your local health department is, you can check in your phone book. The CDC also has a tool to help you locate your state's health department, and many state websites also have listings of all of their local health departments.  3) Find a Walk-in Clinic If your illness is not likely to be very severe or complicated, you may want to try a walk in clinic. These are popping up all over the country in pharmacies, drugstores, and shopping centers. They're usually staffed by nurse practitioners or physician assistants that have been trained to treat common illnesses and complaints. They're usually fairly quick and inexpensive. However, if you have serious medical issues or chronic medical problems, these are probably not your best option.  No Primary Care Doctor: - Call Health Connect at  [phone] - they can help you locate a primary care doctor that  accepts your insurance, provides certain services, etc. - Physician Referral Service- [phone]  Chronic Pain Problems: Organization         Address  Phone    Notes  [facility]  [phone] Patients need to be referred by their primary care doctor.   Medication Assistance: Organization         Address  Phone   Notes  [facility] Medication [facility] [address] [place] [place], [place] [address] [phone] --Must be a resident of [facility] -- Must have NO insurance coverage whatsoever (no Medicaid/ Medicare, etc.) -- The pt. MUST have a primary care doctor that directs their care regularly and follows them in the community   [facility]ist  [phone]   [employer]  [phone]    Agencies that provide inexpensive medical care: Organization         Address  Phone   Notes  [facility]  [phone]   [facility]    [phone]   [facility] [address] [place], [place] [address] [phone]   [facility] [address] [place] [place] [phone]   Planned Parenthood    [phone]   [facility]    [phone]   [facility]  [address], [place] Phone:  [phone]) [phone], Fax:  [phone] Hours of Operation:  9 am - 6 pm, M-F.  Also accepts Medicaid/Medicare and self-pay.  [facility]  [address], [place] Phone: [phone], Fax: [phone]. Hours of Operation:  8:30 am - 5:30 pm, M-F.  Also accepts Medicaid and self-pay.  [facility] [place] [address], [place] Phone: [phone]) [phone]   [facility] [address], [place], [place] [phone], Ext. [phone] Mondays & Thursdays: 7-9 AM.  First 15 patients are seen on a first come, first serve basis.    Medicaid-accepting [facility] Providers:  Organization         Address  Phone   Notes  [facility] [address], [place] [phone] Also accepts self-pay patients.  [facility]  [address], [place]  [phone](336) [phone]   [facility] [address], [place] [phone]) [phone]   [facility] [address] [place] [address], [place] [phone](336) [phone]   Renaye RakersVeita Bland [address], [place]   [phone] Only accepts [place] Access [place] patients after they have their name applied to their card.   Self-Pay (no insurance) in [facility]:  Organization         Address  Phone   Notes  Sickle Cell Patients, [facility] [address], [place] [phone]) [phone]   [facility] [address] [place] [address], [place] [phone]) [phone]   [facility] [place]  [address], [place] [phone]   [facility]/Dr. Osei-Bonsu  [address] [place] [address], [place] or [phone], [place] [phone] Phone number for both [place] and [place] locations is the same.  [facility] [address], [place] [phone]   [facility] [address] [place] [address], [place] or [address] [phone]) [phone]   [facility] [address], [place] [phone]) [phone], phone; [phone](336) [phone], fax Sees patients 1st and 3rd Saturday of every month.  Must not qualify for public or private insurance (i.e. Medicaid, Medicare, [place] Health Choice, Veterans' Benefits)  Household income should be no more than 200% of the poverty level The clinic cannot treat you if you are pregnant or think you are pregnant  Sexually transmitted diseases are not treated at the clinic.    Dental Care: Organization         Address  Phone  Notes  [facility] [address], [place] [phone] Accepts children up to age 34 who are enrolled in [place] or [place] Health Choice; pregnant women with a Medicaid card; and children who have  applied for Medicaid or [place] Health Choice, but were declined, whose parents can pay a reduced fee at time of service.  [facility] [place]  [address], [place] [phone] Accepts children up to age 34 who are enrolled in [place] or [place] Health Choice; pregnant women with a Medicaid card; and children who have applied for Medicaid or  Health Choice, but were declined, whose parents can pay a reduced fee at time of service.  [facility]  [address]  Lynne Logan [phone] Patients are seen by appointment only. Walk-ins are not accepted. [facility] will see patients 35 years of age and older. Monday - Tuesday (8am-5pm) Most Wednesdays (8:30-5pm) $30 per visit, cash only  [facility] Adult Dental Access PROGRAM  [address], [facility] [phone] Patients are seen by appointment only. Walk-ins are not accepted. [facility] will see patients 40 years of age and older. One Wednesday Evening (Monthly: Volunteer Based).  $30 per visit, cash only  [employer]  [phone] for adults; Children under age 65, call Graduate Pediatric Dentistry at [phone]. Children aged 37-14, please call [phone] to request a pediatric application.  Dental services are provided in all areas of dental care including fillings, crowns and bridges, complete and partial dentures, implants, gum treatment, root canals, and extractions. Preventive care is also provided. Treatment is provided to both adults and children. Patients are selected via a lottery and there is often a waiting list.   [facility] [address] [place]  [phone] [email or web address]   [facility] [address] [place], [place] [phone], Ext. [phone] Second and Fourth Thursday of each month, opens at 6:30 AM; Clinic ends at 9 AM.  Patients are seen on a first-come first-served basis, and a  limited number are seen during each clinic.   [facility]  [address] Ether [place], [place] [phone]   Eligibility Requirements You must have lived in [place], [place], or [place] counties for at least the last three months.   You cannot be eligible for state or federal sponsored [place], including [employer], [place], or [employer].   You generally cannot be eligible for healthcare insurance through your employer.    How to apply: Eligibility screenings are held every Tuesday and Wednesday afternoon from 1:00 pm until 4:00 pm. You do not need an appointment for the interview!  [facility] [address], [place], [place] [phone]   [facility] Health Department  [phone]   [facility] Health Department  [phone]   [facility] Health Department  [phone]    Behavioral Health Resources in the Community: Intensive Outpatient Programs Organization         Address  Phone  Notes  [facility] [address] [place], [place] [phone]   [facility] [address] [place], [place] [phone]   [facility] [address] [place], [place]  [phone]   [facility] Mental Health [address],  [place], [place] [phone] or [phone]   Substance Abuse Resources Organization         Address  Phone  Notes  [facility] Drug Services  [phone]   [facility]  [phone]   [facility] [place]  [phone]   Floydene Flock  [phone]   Residential & Outpatient Substance Abuse Program  [phone]   Psychological Services Organization         Address  Phone  Notes  [facility]  [phone]   [facility]  [phone]   [facility] Mental Health [address] [place] [phone] or [phone]    Mobile Crisis Teams Organization          Address  Phone  Notes  [facility], Mobile Crisis Care Unit  4245661468   [facility]  [address] [place], [place] [phone]   Doristine Locks  [address] [place] [phone]    Self-Help/Support Groups Organization         Address  Phone             Notes  Mental Health Assoc. of  - variety of support groups  336- I7437963 Call for more information  Narcotics Anonymous (NA), [facility] [address], [employer] [place]  2 meetings at this location   Statistician         Address  Phone  Notes  [facility] [address],    [place]  [phone]   [facility]  [address], [place] [address], [place], [place] [phone]   [facility] [address] [place], [place] [phone] Admissions: 8am-3pm M-F  [facility] [address],    [place], [place] [phone]   [facility] [address] [place], [place], [place] [phone]   [facility] [address]  [place], [place] [phone]   [facility] - Intensive Outpatient [address], [place], [place] [phone]   [facility] ([facility] [address] [place]  [place], [place] 6-063-016-0109 or [phone]   [facility] (RTS) [address], [place], [place] [phone] Accepts Medicaid  [facility] [place] [address]  [place] [phone] Substance Abuse/Addiction Treatment   [facility] Organization         Address  Phone  Notes  [facility]  [phone]   Angie Fava, PhD [address] [place], [place]   [phone] or [phone]   [facility] Behavioral   [address] [place], [place] [phone]   [facility] [address], [place], [place] [phone] Insurance/Medicaid/sponsorship  through [facility] [address]                                    [place], [place] [phone] Therapy/tele-psych/case  [facility] [address], [place] [phone]    Dr. Lolly Mustache  [phone]   [facility]  [employer] [facility] Dept. 1) [address], [place] 2) [address], [place] 3)  [address], [place] [phone]  [phone]   [facility] Child Abuse Hotline 7403148383 or [phone] (After Hours)

## 2014-03-11 NOTE — ED Notes (Signed)
[phone] [name] EndoKelly pt's daughter. Pt also is able to understand [place].

## 2014-03-11 NOTE — ED Provider Notes (Signed)
Medical screening examination/treatment/procedure(s) were performed by non-physician practitioner and as supervising physician I was immediately available for consultation/collaboration.   EKG Interpretation None       Olivia Mackielga M Otter, MD 03/11/14 1547

## 2014-03-11 NOTE — ED Notes (Signed)
Pt states she was seen here recently for same sx, since being discharged still has not been able to completely empty her bladder. Pt c/o pain with urination, denies any odor to urine or color changes. Pt also c/o lower abd pain, pt rates pain 7/10. Pt has not been taking anything for the pain.

## 2014-03-12 LAB — URINE CULTURE
COLONY COUNT: NO GROWTH
CULTURE: NO GROWTH
SPECIAL REQUESTS: NORMAL

## 2014-05-05 ENCOUNTER — Encounter: Payer: Self-pay | Admitting: *Deleted

## 2014-05-18 ENCOUNTER — Encounter: Payer: Self-pay | Admitting: Obstetrics and Gynecology

## 2014-05-18 ENCOUNTER — Ambulatory Visit (INDEPENDENT_AMBULATORY_CARE_PROVIDER_SITE_OTHER): Payer: Medicaid Other | Admitting: Obstetrics and Gynecology

## 2014-05-18 VITALS — BP 116/75 | HR 88 | Ht 61.0 in | Wt 127.0 lb

## 2014-05-18 DIAGNOSIS — Z3009 Encounter for other general counseling and advice on contraception: Secondary | ICD-10-CM | POA: Diagnosis not present

## 2014-05-18 NOTE — Progress Notes (Signed)
Patient ID: [name], female   DOB: 1981/08/02, 33 y.o.   MRN: [id number] 33 yo G2P2 with LMP 05/16/2014 and BMI 24 presenting today to discuss her desire for sterilization. Patient is not currently sexually active with her husband until she gets this procedure done.  Past Medical History  Diagnosis Date  . Diabetes mellitus without complication    History reviewed. No pertinent past surgical history. Family History  Problem Relation Age of Onset  . Diabetes Mother    History  Substance Use Topics  . Smoking status: Never Smoker   . Smokeless tobacco: Never Used  . Alcohol Use: No   Physical exam GENERAL: Well-developed, well-nourished female in no acute distress.  HEENT: Normocephalic, atraumatic. Sclerae anicteric.  ABDOMEN: Soft, nontender, nondistended. No organomegaly. EXTREMITIES: No cyanosis, clubbing, or edema, 2+ distal pulses.  A/P  33 y.o. [id number]  with undesired fertility, desires permanent sterilization. Other reversible forms of contraception were discussed with patient; she declines all other modalities.  Risks of procedure discussed with patient including permanence of method, bleeding, infection, injury to surrounding organs and need for additional procedures including laparotomy, risk of regret.  Failure risk of 0.5-1% with increased risk of ectopic gestation if pregnancy occurs was also discussed with patient.      Medicaid consent signed today Patient will be scheduled for a laparoscopic bilateral salpingectomy

## 2014-06-14 ENCOUNTER — Encounter (HOSPITAL_COMMUNITY): Payer: Self-pay | Admitting: Emergency Medicine

## 2014-06-14 ENCOUNTER — Emergency Department (HOSPITAL_COMMUNITY)
Admission: EM | Admit: 2014-06-14 | Discharge: 2014-06-14 | Disposition: A | Discharge status: Home / Self Care | Payer: Medicaid Other | Attending: Emergency Medicine | Admitting: Emergency Medicine

## 2014-06-14 DIAGNOSIS — Z3202 Encounter for pregnancy test, result negative: Secondary | ICD-10-CM | POA: Insufficient documentation

## 2014-06-14 DIAGNOSIS — E119 Type 2 diabetes mellitus without complications: Secondary | ICD-10-CM | POA: Insufficient documentation

## 2014-06-14 DIAGNOSIS — R739 Hyperglycemia, unspecified: Secondary | ICD-10-CM

## 2014-06-14 LAB — URINALYSIS, ROUTINE W REFLEX MICROSCOPIC
BILIRUBIN URINE: NEGATIVE
Glucose, UA: >1000 mg/dL — AB
KETONES UR: NEGATIVE mg/dL
Leukocytes, UA: NEGATIVE
Nitrite: NEGATIVE
PROTEIN: NEGATIVE mg/dL
Specific Gravity, Urine: 1.041 — ABNORMAL HIGH (ref 1.005–1.030)
UROBILINOGEN UA: 1 mg/dL (ref 0.0–1.0)
pH: 7 (ref 5.0–8.0)

## 2014-06-14 LAB — CBC WITH DIFFERENTIAL/PLATELET
BASOS ABS: 0 10*3/uL (ref 0.0–0.1)
Basophils Relative: 0 % (ref 0–1)
EOS PCT: 1 % (ref 0–5)
Eosinophils Absolute: 0.1 10*3/uL (ref 0.0–0.7)
HCT: 40.7 % (ref 36.0–46.0)
Hemoglobin: 13.8 g/dL (ref 12.0–15.0)
LYMPHS ABS: 3.8 10*3/uL (ref 0.7–4.0)
Lymphocytes Relative: 43 % (ref 12–46)
MCH: 27.4 pg (ref 26.0–34.0)
MCHC: 33.9 g/dL (ref 30.0–36.0)
MCV: 80.8 fL (ref 78.0–100.0)
Monocytes Absolute: 0.5 10*3/uL (ref 0.1–1.0)
Monocytes Relative: 5 % (ref 3–12)
Neutro Abs: 4.5 10*3/uL (ref 1.7–7.7)
Neutrophils Relative %: 51 % (ref 43–77)
PLATELETS: 332 10*3/uL (ref 150–400)
RBC: 5.04 MIL/uL (ref 3.87–5.11)
RDW: 12.4 % (ref 11.5–15.5)
WBC: 9 10*3/uL (ref 4.0–10.5)

## 2014-06-14 LAB — COMPREHENSIVE METABOLIC PANEL
ALK PHOS: 82 U/L (ref 39–117)
ALT: 58 U/L — ABNORMAL HIGH (ref 0–35)
ANION GAP: 13 (ref 5–15)
AST: 37 U/L (ref 0–37)
Albumin: 3.6 g/dL (ref 3.5–5.2)
BUN: 10 mg/dL (ref 6–23)
CHLORIDE: 96 meq/L (ref 96–112)
CO2: 25 mEq/L (ref 19–32)
Calcium: 9.7 mg/dL (ref 8.4–10.5)
Creatinine, Ser: 0.39 mg/dL — ABNORMAL LOW (ref 0.50–1.10)
GFR calc non Af Amer: >90 mL/min (ref >90)
GLUCOSE: 385 mg/dL — AB (ref 70–99)
Potassium: 4.1 mEq/L (ref 3.7–5.3)
Sodium: 134 mEq/L — ABNORMAL LOW (ref 137–147)
Total Bilirubin: 0.3 mg/dL (ref 0.3–1.2)
Total Protein: 8.3 g/dL (ref 6.0–8.3)

## 2014-06-14 LAB — URINE MICROSCOPIC-ADD ON

## 2014-06-14 LAB — CBG MONITORING, ED: Glucose-Capillary: 357 mg/dL — ABNORMAL HIGH (ref 70–99)

## 2014-06-14 LAB — POC URINE PREG, ED: Preg Test, Ur: NEGATIVE

## 2014-06-14 MED ORDER — METFORMIN HCL 500 MG PO TABS: 500.0000 mg | ORAL_TABLET | Freq: Two times a day (BID) | ORAL | Status: DC

## 2014-06-14 MED ORDER — INSULIN ASPART 100 UNIT/ML ~~LOC~~ SOLN
6.0000 [IU] | Freq: Once | SUBCUTANEOUS | Status: AC
  Administered 2014-06-14: 6 [IU] via INTRAVENOUS
  Filled 2014-06-14: qty 1

## 2014-06-14 MED ORDER — SODIUM CHLORIDE 0.9 % IV BOLUS (SEPSIS)
1000.0000 mL | Freq: Once | INTRAVENOUS | Status: AC
  Administered 2014-06-14: 1000 mL via INTRAVENOUS

## 2014-06-14 NOTE — ED Notes (Signed)
Pt c/o hyperglycemia from PCP; pt sts hx of DM but does not have any medicine

## 2014-06-14 NOTE — Discharge Instructions (Signed)
Metformin as prescribed.  Followup with your primary Dr. as scheduled for future refills.   Hyperglycemia Hyperglycemia occurs when the glucose (sugar) in your blood is too high. Hyperglycemia can happen for many reasons, but it most often happens to people who do not know they have diabetes or are not managing their diabetes properly.  CAUSES  Whether you have diabetes or not, there are other causes of hyperglycemia. Hyperglycemia can occur when you have diabetes, but it can also occur in other situations that you might not be as aware of, such as: Diabetes  If you have diabetes and are having problems controlling your blood glucose, hyperglycemia could occur because of some of the following reasons:  Not following your meal plan.  Not taking your diabetes medications or not taking it properly.  Exercising less or doing less activity than you normally do.  Being sick. Pre-diabetes  This cannot be ignored. Before people develop Type 2 diabetes, they almost always have "pre-diabetes." This is when your blood glucose levels are higher than normal, but not yet high enough to be diagnosed as diabetes. Research has shown that some long-term damage to the body, especially the heart and circulatory system, may already be occurring during pre-diabetes. If you take action to manage your blood glucose when you have pre-diabetes, you may delay or prevent Type 2 diabetes from developing. Stress  If you have diabetes, you may be "diet" controlled or on oral medications or insulin to control your diabetes. However, you may find that your blood glucose is higher than usual in the hospital whether you have diabetes or not. This is often referred to as "stress hyperglycemia." Stress can elevate your blood glucose. This happens because of hormones put out by the body during times of stress. If stress has been the cause of your high blood glucose, it can be followed regularly by your caregiver. That way he/she  can make sure your hyperglycemia does not continue to get worse or progress to diabetes. Steroids  Steroids are medications that act on the infection fighting system (immune system) to block inflammation or infection. One side effect can be a rise in blood glucose. Most people can produce enough extra insulin to allow for this rise, but for those who cannot, steroids make blood glucose levels go even higher. It is not unusual for steroid treatments to "uncover" diabetes that is developing. It is not always possible to determine if the hyperglycemia will go away after the steroids are stopped. A special blood test called an A1c is sometimes done to determine if your blood glucose was elevated before the steroids were started. SYMPTOMS  Thirsty.  Frequent urination.  Dry mouth.  Blurred vision.  Tired or fatigue.  Weakness.  Sleepy.  Tingling in feet or leg. DIAGNOSIS  Diagnosis is made by monitoring blood glucose in one or all of the following ways:  A1c test. This is a chemical found in your blood.  Fingerstick blood glucose monitoring.  Laboratory results. TREATMENT  First, knowing the cause of the hyperglycemia is important before the hyperglycemia can be treated. Treatment may include, but is not be limited to:  Education.  Change or adjustment in medications.  Change or adjustment in meal plan.  Treatment for an illness, infection, etc.  More frequent blood glucose monitoring.  Change in exercise plan.  Decreasing or stopping steroids.  Lifestyle changes. HOME CARE INSTRUCTIONS   Test your blood glucose as directed.  Exercise regularly. Your caregiver will give you instructions about  exercise. Pre-diabetes or diabetes which comes on with stress is helped by exercising.  Eat wholesome, balanced meals. Eat often and at regular, fixed times. Your caregiver or nutritionist will give you a meal plan to guide your sugar intake.  Being at an ideal weight is  important. If needed, losing as little as 10 to 15 pounds may help improve blood glucose levels. SEEK MEDICAL CARE IF:   You have questions about medicine, activity, or diet.  You continue to have symptoms (problems such as increased thirst, urination, or weight gain). SEEK IMMEDIATE MEDICAL CARE IF:   You are vomiting or have diarrhea.  Your breath smells fruity.  You are breathing faster or slower.  You are very sleepy or incoherent.  You have numbness, tingling, or pain in your feet or hands.  You have chest pain.  Your symptoms get worse even though you have been following your caregiver's orders.  If you have any other questions or concerns. Document Released: 02/26/2001 Document Revised: 11/25/2011 Document Reviewed: 12/30/2011 [facility] Patient Information 2015 [employer], [place]. This information is not intended to replace advice given to you by your health care provider. Make sure you discuss any questions you have with your health care provider.

## 2014-06-14 NOTE — ED Notes (Signed)
Patient states that she went to the clinic for her exam and her blood sugar was found to be high.  She has been without her metformin for 2 months. She denies pain, nausea, vomiting.

## 2014-06-14 NOTE — ED Provider Notes (Signed)
CSN: [id number]     Arrival date & time 06/14/14  1502 History   First MD Initiated Contact with Patient 06/14/14 1813     Chief Complaint  Patient presents with  . Hyperglycemia     (Consider location/radiation/quality/duration/timing/severity/associated sxs/prior Treatment) HPI Comments: Patient is a 33 year old female with history of type 2 diabetes. She presents for evaluation of elevated blood sugar. She is normally taking metformin twice daily, however she ran out of this medication 2 months ago and was unable to get an appointment with her primary Dr. to have it refilled do to financial reasons. She was finally able to obtain an appointment and medical assistance. She went for her appointment today and was told her sugar was too high and was told to come here. She denies to me she is having any symptoms. She tells me she feels well.  The patient is [place] and speaks limited [place]. The history was obtained through the sister who is at bedside and acted as Nurse, learning disabilitytranslator.  Patient is a 33 y.o. female presenting with hyperglycemia. The history is provided by the patient.  Hyperglycemia Blood sugar level PTA:  >500 Severity:  Moderate Onset quality:  Gradual Duration:  2 months Timing:  Constant Progression:  Worsening Diabetes status:  Controlled with oral medications Relieved by:  Nothing Ineffective treatments:  None tried   Past Medical History  Diagnosis Date  . Diabetes mellitus without complication    History reviewed. No pertinent past surgical history. Family History  Problem Relation Age of Onset  . Diabetes Mother    History  Substance Use Topics  . Smoking status: Never Smoker   . Smokeless tobacco: Never Used  . Alcohol Use: No   OB History   Grav Para Term Preterm Abortions TAB SAB Ect Mult Living   2 2 2       2      Review of Systems  All other systems reviewed and are negative.     Allergies  Review of patient's allergies indicates no known  allergies.  Home Medications   Prior to Admission medications   Not on File   BP 127/89  Pulse 95  Temp(Src) 98.4 F (36.9 C) (Oral)  Resp 21  SpO2 99%  LMP 05/16/2014 Physical Exam  Nursing note and vitals reviewed. Constitutional: She is oriented to person, place, and time. She appears well-developed and well-nourished. No distress.  HENT:  Head: Normocephalic and atraumatic.  Neck: Normal range of motion. Neck supple.  Cardiovascular: Normal rate and regular rhythm.  Exam reveals no gallop and no friction rub.   No murmur heard. Pulmonary/Chest: Effort normal and breath sounds normal. No respiratory distress. She has no wheezes.  Abdominal: Soft. Bowel sounds are normal. She exhibits no distension. There is no tenderness.  Musculoskeletal: Normal range of motion.  Neurological: She is alert and oriented to person, place, and time.  Skin: Skin is warm and dry. She is not diaphoretic.    ED Course  Procedures (including critical care time) Labs Review Labs Reviewed  COMPREHENSIVE METABOLIC PANEL - Abnormal; Notable for the following:    Sodium 134 (*)    Glucose, Bld 385 (*)    Creatinine, Ser 0.39 (*)    ALT 58 (*)    All other components within normal limits  CBG MONITORING, ED - Abnormal; Notable for the following:    Glucose-Capillary 357 (*)    All other components within normal limits  CBC WITH DIFFERENTIAL  URINALYSIS, ROUTINE W REFLEX MICROSCOPIC  POC URINE PREG, ED    Imaging Review No results found.   EKG Interpretation None      MDM   Final diagnoses:  None    Sugars much improved with ivf, insulin.  Labs not consistent with dka.  Will prescribe metformin, return prn.    Geoffery Lyons, MD 06/15/14 [phone]

## 2014-06-14 NOTE — ED Notes (Signed)
CBG 126, CBG result not crossing over

## 2014-06-15 LAB — CBG MONITORING, ED: Glucose-Capillary: 126 mg/dL — ABNORMAL HIGH (ref 70–99)

## 2014-07-15 ENCOUNTER — Encounter (HOSPITAL_COMMUNITY): Payer: Self-pay | Admitting: *Deleted

## 2014-07-18 ENCOUNTER — Encounter (HOSPITAL_COMMUNITY): Payer: Self-pay | Admitting: *Deleted

## 2014-07-18 NOTE — H&P (Signed)
[name] is an 33 y.o. female G2P2 who is here today foe a scheduled bilateral salpingectomy for her desire of permanent sterilization. Patient is without any other complaints.  Pertinent Gynecological History: Menses: regular every month without intermenstrual spotting Contraception: abstinence DES exposure: denies Blood transfusions: none Sexually transmitted diseases: no past history Previous GYN Procedures: none  Last pap: normal Date: 2012 OB History: G2, P2   Menstrual History: No LMP recorded.    Past Medical History  Diagnosis Date  . Diabetes mellitus without complication     on Metformin    History reviewed. No pertinent past surgical history.  Family History  Problem Relation Age of Onset  . Diabetes Mother     Social History:  reports that she has never smoked. She has never used smokeless tobacco. She reports that she does not drink alcohol or use illicit drugs.  Allergies: No Known Allergies  Prescriptions prior to admission  Medication Sig Dispense Refill Last Dose  . metFORMIN (GLUCOPHAGE) 500 MG tablet Take 1 tablet (500 mg total) by mouth 2 (two) times daily with a meal. 60 tablet 1 07/18/2014 at Unknown time    Review of Systems  All other systems reviewed and are negative.   Blood pressure 130/83, pulse 92, temperature 98.1 F (36.7 C), temperature source Oral, resp. rate 20. Physical Exam GENERAL: Well-developed, well-nourished female in no acute distress.  HEENT: Normocephalic, atraumatic. Sclerae anicteric.  NECK: Supple. Normal thyroid.  LUNGS: Clear to auscultation bilaterally.  HEART: Regular rate and rhythm. ABDOMEN: Soft, nontender, nondistended. No organomegaly. PELVIC: Deferred to OR EXTREMITIES: No cyanosis, clubbing, or edema, 2+ distal pulses.  Results for orders placed or performed during the hospital encounter of 07/19/14 (from the past 24 hour(s))  CBC     Status: Abnormal   Collection Time: 07/19/14 11:42 AM  Result Value  Ref Range   WBC 11.0 (H) 4.0 - 10.5 K/uL   RBC 4.77 3.87 - 5.11 MIL/uL   Hemoglobin 13.7 12.0 - 15.0 g/dL   HCT 16.139.6 09.636.0 - 04.546.0 %   MCV 83.0 78.0 - 100.0 fL   MCH 28.7 26.0 - 34.0 pg   MCHC 34.6 30.0 - 36.0 g/dL   RDW 40.912.6 81.111.5 - 91.415.5 %   Platelets 305 150 - 400 K/uL    No results found.  Assessment/Plan: 33 yo G2P2 with undesired fertility here for laparoscopic bilateral salpingectomy Other reversible forms of contraception were discussed with patient; she declines all other modalities.  Risks of procedure discussed with patient including permanence of method, bleeding, infection, injury to surrounding organs and need for additional procedures including laparotomy, risk of regret.  Failure risk of 0.5-1% with increased risk of ectopic gestation if pregnancy occurs was also discussed with patient.       Velda Wendt 07/19/2014, 12:25 PM

## 2014-07-19 ENCOUNTER — Ambulatory Visit (HOSPITAL_COMMUNITY)
Admission: RE | Admit: 2014-07-19 | Discharge: 2014-07-19 | Disposition: A | Discharge status: Home / Self Care | Payer: Medicaid Other | Source: Ambulatory Visit | Attending: Obstetrics and Gynecology | Admitting: Obstetrics and Gynecology

## 2014-07-19 ENCOUNTER — Ambulatory Visit (HOSPITAL_COMMUNITY): Payer: Medicaid Other | Admitting: Anesthesiology

## 2014-07-19 ENCOUNTER — Encounter (HOSPITAL_COMMUNITY): Payer: Self-pay | Admitting: Anesthesiology

## 2014-07-19 ENCOUNTER — Encounter (HOSPITAL_COMMUNITY)
Admission: RE | Disposition: A | Discharge status: Home / Self Care | Payer: Self-pay | Source: Ambulatory Visit | Attending: Obstetrics and Gynecology

## 2014-07-19 DIAGNOSIS — Z79899 Other long term (current) drug therapy: Secondary | ICD-10-CM | POA: Insufficient documentation

## 2014-07-19 DIAGNOSIS — E119 Type 2 diabetes mellitus without complications: Secondary | ICD-10-CM | POA: Insufficient documentation

## 2014-07-19 DIAGNOSIS — Z302 Encounter for sterilization: Secondary | ICD-10-CM

## 2014-07-19 HISTORY — PX: LAPAROSCOPIC BILATERAL SALPINGECTOMY: SHX5889

## 2014-07-19 LAB — BASIC METABOLIC PANEL
Anion gap: 11 (ref 5–15)
BUN: 7 mg/dL (ref 6–23)
CALCIUM: 9.2 mg/dL (ref 8.4–10.5)
CHLORIDE: 99 meq/L (ref 96–112)
CO2: 26 meq/L (ref 19–32)
Creatinine, Ser: 0.43 mg/dL — ABNORMAL LOW (ref 0.50–1.10)
GFR calc Af Amer: >90 mL/min (ref >90)
GFR calc non Af Amer: >90 mL/min (ref >90)
GLUCOSE: 171 mg/dL — AB (ref 70–99)
Potassium: 3.9 mEq/L (ref 3.7–5.3)
SODIUM: 136 meq/L — AB (ref 137–147)

## 2014-07-19 LAB — CBC
HCT: 39.6 % (ref 36.0–46.0)
HEMOGLOBIN: 13.7 g/dL (ref 12.0–15.0)
MCH: 28.7 pg (ref 26.0–34.0)
MCHC: 34.6 g/dL (ref 30.0–36.0)
MCV: 83 fL (ref 78.0–100.0)
Platelets: 305 10*3/uL (ref 150–400)
RBC: 4.77 MIL/uL (ref 3.87–5.11)
RDW: 12.6 % (ref 11.5–15.5)
WBC: 11 10*3/uL — AB (ref 4.0–10.5)

## 2014-07-19 LAB — GLUCOSE, CAPILLARY
GLUCOSE-CAPILLARY: 150 mg/dL — AB (ref 70–99)
Glucose-Capillary: 229 mg/dL — ABNORMAL HIGH (ref 70–99)

## 2014-07-19 LAB — PREGNANCY, URINE: Preg Test, Ur: NEGATIVE

## 2014-07-19 SURGERY — SALPINGECTOMY, BILATERAL, LAPAROSCOPIC
Anesthesia: General | Laterality: Bilateral

## 2014-07-19 SURGERY — Surgical Case
Anesthesia: *Unknown

## 2014-07-19 MED ORDER — ROCURONIUM BROMIDE 100 MG/10ML IV SOLN
INTRAVENOUS | Status: AC
  Filled 2014-07-19: qty 1

## 2014-07-19 MED ORDER — OXYCODONE-ACETAMINOPHEN 5-325 MG PO TABS
1.0000 | ORAL_TABLET | Freq: Once | ORAL | Status: AC
Start: 2014-07-19 — End: 2014-07-19
  Administered 2014-07-19: 1 via ORAL

## 2014-07-19 MED ORDER — ONDANSETRON HCL 4 MG/2ML IJ SOLN
INTRAMUSCULAR | Status: DC | PRN
  Administered 2014-07-19: 4 mg via INTRAVENOUS

## 2014-07-19 MED ORDER — FENTANYL CITRATE 0.05 MG/ML IJ SOLN
INTRAMUSCULAR | Status: AC
  Filled 2014-07-19: qty 5

## 2014-07-19 MED ORDER — BUPIVACAINE HCL (PF) 0.25 % IJ SOLN
INTRAMUSCULAR | Status: AC
  Filled 2014-07-19: qty 30

## 2014-07-19 MED ORDER — LACTATED RINGERS IV SOLN
INTRAVENOUS | Status: DC
  Administered 2014-07-19 (×3): via INTRAVENOUS

## 2014-07-19 MED ORDER — OXYCODONE-ACETAMINOPHEN 5-325 MG PO TABS: 1.0000 | ORAL_TABLET | ORAL | Status: DC | PRN

## 2014-07-19 MED ORDER — PROPOFOL 10 MG/ML IV BOLUS
INTRAVENOUS | Status: DC | PRN
  Administered 2014-07-19: 100 mg via INTRAVENOUS

## 2014-07-19 MED ORDER — LIDOCAINE HCL (CARDIAC) 20 MG/ML IV SOLN
INTRAVENOUS | Status: AC
  Filled 2014-07-19: qty 5

## 2014-07-19 MED ORDER — GLYCOPYRROLATE 0.2 MG/ML IJ SOLN
INTRAMUSCULAR | Status: AC
Start: 2014-07-19 — End: 2014-07-19
  Filled 2014-07-19: qty 3

## 2014-07-19 MED ORDER — SCOPOLAMINE 1 MG/3DAYS TD PT72
1.0000 | MEDICATED_PATCH | Freq: Once | TRANSDERMAL | Status: DC
  Administered 2014-07-19: 1.5 mg via TRANSDERMAL

## 2014-07-19 MED ORDER — ONDANSETRON HCL 4 MG/2ML IJ SOLN
INTRAMUSCULAR | Status: AC
  Filled 2014-07-19: qty 2

## 2014-07-19 MED ORDER — FENTANYL CITRATE 0.05 MG/ML IJ SOLN
INTRAMUSCULAR | Status: AC
  Administered 2014-07-19: 50 ug via INTRAVENOUS
  Filled 2014-07-19: qty 2

## 2014-07-19 MED ORDER — OXYCODONE-ACETAMINOPHEN 5-325 MG PO TABS
ORAL_TABLET | ORAL | Status: AC
  Filled 2014-07-19: qty 1

## 2014-07-19 MED ORDER — NEOSTIGMINE METHYLSULFATE 10 MG/10ML IV SOLN
INTRAVENOUS | Status: AC
  Filled 2014-07-19: qty 1

## 2014-07-19 MED ORDER — IBUPROFEN 600 MG PO TABS: 600.0000 mg | ORAL_TABLET | Freq: Four times a day (QID) | ORAL | Status: DC | PRN

## 2014-07-19 MED ORDER — KETOROLAC TROMETHAMINE 30 MG/ML IJ SOLN
INTRAMUSCULAR | Status: AC
  Filled 2014-07-19: qty 1

## 2014-07-19 MED ORDER — SCOPOLAMINE 1 MG/3DAYS TD PT72
MEDICATED_PATCH | TRANSDERMAL | Status: AC
  Filled 2014-07-19: qty 1

## 2014-07-19 MED ORDER — BUPIVACAINE HCL (PF) 0.25 % IJ SOLN
INTRAMUSCULAR | Status: DC | PRN
  Administered 2014-07-19: 10 mL

## 2014-07-19 MED ORDER — MIDAZOLAM HCL 2 MG/2ML IJ SOLN
INTRAMUSCULAR | Status: DC | PRN
  Administered 2014-07-19: 2 mg via INTRAVENOUS

## 2014-07-19 MED ORDER — PROPOFOL 10 MG/ML IV EMUL
INTRAVENOUS | Status: AC
  Filled 2014-07-19: qty 20

## 2014-07-19 MED ORDER — KETOROLAC TROMETHAMINE 30 MG/ML IJ SOLN
INTRAMUSCULAR | Status: DC | PRN
  Administered 2014-07-19: 30 mg via INTRAVENOUS

## 2014-07-19 MED ORDER — FENTANYL CITRATE 0.05 MG/ML IJ SOLN
INTRAMUSCULAR | Status: DC | PRN
  Administered 2014-07-19: 25 ug via INTRAVENOUS
  Administered 2014-07-19 (×3): 50 ug via INTRAVENOUS
  Administered 2014-07-19: 25 ug via INTRAVENOUS

## 2014-07-19 MED ORDER — ACETAMINOPHEN 160 MG/5ML PO SOLN
ORAL | Status: AC
  Filled 2014-07-19: qty 20.3

## 2014-07-19 MED ORDER — LIDOCAINE HCL (CARDIAC) 20 MG/ML IV SOLN
INTRAVENOUS | Status: DC | PRN
  Administered 2014-07-19: 60 mg via INTRAVENOUS

## 2014-07-19 MED ORDER — NEOSTIGMINE METHYLSULFATE 10 MG/10ML IV SOLN
INTRAVENOUS | Status: DC | PRN
  Administered 2014-07-19: 3 mg via INTRAVENOUS

## 2014-07-19 MED ORDER — DEXAMETHASONE SODIUM PHOSPHATE 10 MG/ML IJ SOLN
INTRAMUSCULAR | Status: AC
  Filled 2014-07-19: qty 1

## 2014-07-19 MED ORDER — ACETAMINOPHEN 160 MG/5ML PO SOLN
950.0000 mg | Freq: Four times a day (QID) | ORAL | Status: DC | PRN
  Administered 2014-07-19: 950 mg via ORAL

## 2014-07-19 MED ORDER — DOCUSATE SODIUM 100 MG PO CAPS: 100.0000 mg | ORAL_CAPSULE | Freq: Two times a day (BID) | ORAL | Status: DC | PRN

## 2014-07-19 MED ORDER — GLYCOPYRROLATE 0.2 MG/ML IJ SOLN
INTRAMUSCULAR | Status: DC | PRN
  Administered 2014-07-19: 0.4 mg via INTRAVENOUS

## 2014-07-19 MED ORDER — ROCURONIUM BROMIDE 100 MG/10ML IV SOLN
INTRAVENOUS | Status: DC | PRN
  Administered 2014-07-19: 40 mg via INTRAVENOUS

## 2014-07-19 MED ORDER — FENTANYL CITRATE 0.05 MG/ML IJ SOLN
25.0000 ug | INTRAMUSCULAR | Status: DC | PRN
  Administered 2014-07-19: 50 ug via INTRAVENOUS

## 2014-07-19 MED ORDER — MIDAZOLAM HCL 2 MG/2ML IJ SOLN
INTRAMUSCULAR | Status: AC
Start: 2014-07-19 — End: 2014-07-19
  Filled 2014-07-19: qty 2

## 2014-07-19 MED ORDER — DEXAMETHASONE SODIUM PHOSPHATE 10 MG/ML IJ SOLN
INTRAMUSCULAR | Status: DC | PRN
  Administered 2014-07-19: 4 mg via INTRAVENOUS

## 2014-07-19 SURGICAL SUPPLY — 27 items
BLADE SURG 11 STRL SS (BLADE) ×2 IMPLANT
CHLORAPREP W/TINT 26ML (MISCELLANEOUS) ×2 IMPLANT
DRSG COVADERM PLUS 2X2 (GAUZE/BANDAGES/DRESSINGS) ×2 IMPLANT
DRSG OPSITE POSTOP 3X4 (GAUZE/BANDAGES/DRESSINGS) ×2 IMPLANT
ELECT REM PT RETURN 9FT ADLT (ELECTROSURGICAL)
ELECTRODE REM PT RTRN 9FT ADLT (ELECTROSURGICAL) IMPLANT
GLOVE BIOGEL PI IND STRL 6.5 (GLOVE) ×1 IMPLANT
GLOVE BIOGEL PI INDICATOR 6.5 (GLOVE) ×1
GLOVE SURG SS PI 6.0 STRL IVOR (GLOVE) ×2 IMPLANT
GOWN STRL REUS W/TWL LRG LVL3 (GOWN DISPOSABLE) ×4 IMPLANT
LIQUID BAND (GAUZE/BANDAGES/DRESSINGS) ×2 IMPLANT
NS IRRIG 1000ML POUR BTL (IV SOLUTION) ×2 IMPLANT
PACK LAPAROSCOPY BASIN (CUSTOM PROCEDURE TRAY) ×2 IMPLANT
PAD TRENDELENBURG OR TABLE (MISCELLANEOUS) ×2 IMPLANT
POUCH SPECIMEN RETRIEVAL 10MM (ENDOMECHANICALS) IMPLANT
PROTECTOR NERVE ULNAR (MISCELLANEOUS) ×2 IMPLANT
SEALER TISSUE G2 CVD JAW 35 (ENDOMECHANICALS) IMPLANT
SEALER TISSUE G2 CVD JAW 45CM (ENDOMECHANICALS)
SET IRRIG TUBING LAPAROSCOPIC (IRRIGATION / IRRIGATOR) IMPLANT
SHEARS HARMONIC ACE PLUS 36CM (ENDOMECHANICALS) IMPLANT
SUT MNCRL AB 4-0 PS2 18 (SUTURE) ×2 IMPLANT
SUT VICRYL 0 UR6 27IN ABS (SUTURE) ×4 IMPLANT
TOWEL OR 17X24 6PK STRL BLUE (TOWEL DISPOSABLE) ×4 IMPLANT
TRAY FOLEY CATH 14FR (SET/KITS/TRAYS/PACK) ×2 IMPLANT
TROCAR BALLN 12MMX100 BLUNT (TROCAR) ×2 IMPLANT
TROCAR XCEL NON-BLD 5MMX100MML (ENDOMECHANICALS) IMPLANT
WATER STERILE IRR 1000ML POUR (IV SOLUTION) ×2 IMPLANT

## 2014-07-19 NOTE — Op Note (Signed)
Allessandra Goede PROCEDURE DATE: 07/19/2014   PREOPERATIVE DIAGNOSIS:  Undesired fertility  POSTOPERATIVE DIAGNOSIS:  Undesired fertility  PROCEDURE:  Laparoscopic Bilateral Salpingectomy   SURGEON: Catalina AntiguaPeggy Mileydi Milsap, MD  ANESTHESIA:  General endotracheal  COMPLICATIONS:  None immediate.  ESTIMATED BLOOD LOSS:  10 ml.  FLUIDS: 1500 ml LR.  URINE OUTPUT:  200 ml of clear urine.  IINDICATIONS: 33 y.o. [id number] with undesired fertility, desires permanent sterilization. Other reversible forms of contraception were discussed with patient; she declines all other modalities.  Risks of procedure discussed with patient including permanence of method, risk of regret, bleeding, infection, injury to surrounding organs and need for additional procedures including laparotomy.  Failure risk less than 0.5% with increased risk of ectopic gestation if pregnancy occurs was also discussed with patient.  Written informed consent was obtained.    FINDINGS:  Normal uterus, fallopian tubes, and ovaries.  TECHNIQUE:  The patient was taken to the operating room where general anesthesia was obtained without difficulty.  She was then placed in the dorsal lithotomy position and prepared and draped in sterile fashion.  After an adequate timeout was performed, a bivalved speculum was then placed in the patient's vagina, and the anterior lip of cervix grasped with the single-tooth tenaculum.  The uterine manipulator was then advanced into the uterus.  The speculum was removed from the vagina.  Attention was then turned to the patient's abdomen where a 11-mm skin incision was made in the umbilical fold.  The fascia was identified, tented up with Kocher clamps and incised with mayor scissors. The fascia was tagged with 0-Vycril. The 11-mm trocar and sleeve were then advanced without difficulty into the abdomen. Intraabdominal placement was confirmed with the laparoscope. The abdomen was then insufflated with carbon dioxide gas.   Adequate pneumoperitoneum was obtained.  A survey of the patient's pelvis and abdomen revealed the findings above.  Bilateral 5-mm lower quadrant ports  were then placed under direct visualization.  The fallopian tubes were transected from the uterine attachments and the underlying mesosalpinx with the Enseal device allowing for bilateral salpingectomy.  The fallopian tubes were then removed from the abdomen under direct visualization.  The operative site was surveyed, and it was found to be hemostatic.   No intraoperative injury to other surrounding organs was noted.  The abdomen was desufflated and all instruments were then removed from the patient's abdomen. The fascial incision of the umbilicus was closed with a 0 Vicryl figure of eight stitch. All skin incisions were closed with Dermabond.  The uterine manipulator was removed from the vagina without complications. The patient tolerated the procedure well.  Sponge, lap, and needle counts were correct times two.  The patient was then taken to the recovery room awake, extubated and in stable condition.  The patient will be discharged to home as per PACU criteria.  Routine postoperative instructions given.

## 2014-07-19 NOTE — Anesthesia Preprocedure Evaluation (Signed)
Anesthesia Evaluation  Patient identified by MRN, date of birth, ID band Patient awake    Reviewed: Allergy & Precautions, H&P , Patient's Chart, lab work & pertinent test results, reviewed documented beta blocker date and time   Airway Mallampati: II TM Distance: >3 FB Neck ROM: full    Dental no notable dental hx.    Pulmonary  breath sounds clear to auscultation  Pulmonary exam normal       Cardiovascular Rhythm:regular Rate:Normal     Neuro/Psych    GI/Hepatic   Endo/Other  diabetes, Type 2  Renal/GU      Musculoskeletal   Abdominal   Peds  Hematology   Anesthesia Other Findings   Reproductive/Obstetrics                           Anesthesia Physical Anesthesia Plan  ASA: II  Anesthesia Plan: General   Post-op Pain Management:    Induction: Intravenous  Airway Management Planned: Oral ETT  Additional Equipment:   Intra-op Plan:   Post-operative Plan: Extubation in OR  Informed Consent: I have reviewed the patients History and Physical, chart, labs and discussed the procedure including the risks, benefits and alternatives for the proposed anesthesia with the patient or authorized representative who has indicated his/her understanding and acceptance.   Dental Advisory Given and Dental advisory given  Plan Discussed with: CRNA and Surgeon  Anesthesia Plan Comments: (  Discussed general anesthesia, including possible nausea, instrumentation of airway, sore throat,pulmonary aspiration, etc. I asked if the were any outstanding questions, or  concerns before we proceeded. )        Anesthesia Quick Evaluation  

## 2014-07-19 NOTE — Discharge Instructions (Signed)

## 2014-07-19 NOTE — Anesthesia Postprocedure Evaluation (Signed)
  Anesthesia Post-op Note  Anesthesia Post Note  Patient: [name]  Procedure(s) Performed: Procedure(s) (LRB): LAPAROSCOPIC BILATERAL SALPINGECTOMY (Bilateral)  Anesthesia type: General  Patient location: PACU  Post pain: Pain level controlled  Post assessment: Post-op Vital signs reviewed  Last Vitals:  Filed Vitals:   07/19/14 1545  BP: 98/62  Pulse: 81  Temp:   Resp: 16    Post vital signs: Reviewed  Level of consciousness: sedated  Complications: No apparent anesthesia complications

## 2014-07-19 NOTE — Transfer of Care (Signed)
Immediate Anesthesia Transfer of Care Note  Patient: [name]  Procedure(s) Performed: Procedure(s): LAPAROSCOPIC BILATERAL SALPINGECTOMY (Bilateral)  Patient Location: PACU  Anesthesia Type:General  Level of Consciousness: awake, alert , oriented and patient cooperative  Airway & Oxygen Therapy: Patient Spontanous Breathing and Patient connected to nasal cannula oxygen  Post-op Assessment: Report given to PACU RN and Post -op Vital signs reviewed and stable  Post vital signs: Reviewed and stable  Complications: No apparent anesthesia complications

## 2014-07-20 ENCOUNTER — Encounter (HOSPITAL_COMMUNITY): Payer: Self-pay | Admitting: Obstetrics and Gynecology

## 2014-07-20 ENCOUNTER — Encounter: Payer: Self-pay | Admitting: Obstetrics and Gynecology

## 2014-08-08 ENCOUNTER — Encounter: Payer: Self-pay | Admitting: Obstetrics and Gynecology

## 2014-08-08 ENCOUNTER — Ambulatory Visit (INDEPENDENT_AMBULATORY_CARE_PROVIDER_SITE_OTHER): Payer: Self-pay | Admitting: Obstetrics and Gynecology

## 2014-08-08 VITALS — BP 122/72 | HR 105 | Temp 97.6°F | Wt 125.6 lb

## 2014-08-08 DIAGNOSIS — Z9889 Other specified postprocedural states: Secondary | ICD-10-CM

## 2014-08-08 NOTE — Progress Notes (Signed)
Patient ID: [name], female   DOB: 21-Oct-1980, 33 y.o.   MRN: [id number] 33 yo G2P2 s/p LSC bilateral salpingectomy for permanent sterilization on 07/19/2014 presenting today for post op check. Patient has been doing well since surgery and is without complaints.   GENERAL: Well-developed, well-nourished female in no acute distress.  ABDOMEN: Soft, nontender, nondistended. No organomegaly. Incision: no erythema, induration or drainage. Healing very well  EXTREMITIES: No cyanosis, clubbing, or edema, 2+ distal pulses.  A/P 33 yo here for postop check - patient is medically cleared to resume all activities of daily living - RTC for annual exam  - Patient seen in the presence of interpreter [facility] Siu

## 2015-01-18 ENCOUNTER — Emergency Department (HOSPITAL_COMMUNITY)
Admission: EM | Admit: 2015-01-18 | Discharge: 2015-01-18 | Disposition: A | Discharge status: Home / Self Care | Payer: 59 | Attending: Emergency Medicine | Admitting: Emergency Medicine

## 2015-01-18 ENCOUNTER — Encounter (HOSPITAL_COMMUNITY): Payer: Self-pay | Admitting: *Deleted

## 2015-01-18 DIAGNOSIS — Z3202 Encounter for pregnancy test, result negative: Secondary | ICD-10-CM | POA: Insufficient documentation

## 2015-01-18 DIAGNOSIS — M545 Low back pain: Secondary | ICD-10-CM | POA: Diagnosis present

## 2015-01-18 DIAGNOSIS — Z79899 Other long term (current) drug therapy: Secondary | ICD-10-CM | POA: Diagnosis not present

## 2015-01-18 DIAGNOSIS — E1165 Type 2 diabetes mellitus with hyperglycemia: Secondary | ICD-10-CM | POA: Insufficient documentation

## 2015-01-18 DIAGNOSIS — R739 Hyperglycemia, unspecified: Secondary | ICD-10-CM

## 2015-01-18 DIAGNOSIS — N390 Urinary tract infection, site not specified: Secondary | ICD-10-CM | POA: Insufficient documentation

## 2015-01-18 LAB — URINALYSIS, ROUTINE W REFLEX MICROSCOPIC
Bilirubin Urine: NEGATIVE
GLUCOSE, UA: NEGATIVE mg/dL
Ketones, ur: NEGATIVE mg/dL
Nitrite: NEGATIVE
PH: 6.5 (ref 5.0–8.0)
Protein, ur: 30 mg/dL — AB
Specific Gravity, Urine: 1.014 (ref 1.005–1.030)
UROBILINOGEN UA: 0.2 mg/dL (ref 0.0–1.0)

## 2015-01-18 LAB — URINE MICROSCOPIC-ADD ON

## 2015-01-18 LAB — POC URINE PREG, ED: Preg Test, Ur: NEGATIVE

## 2015-01-18 LAB — CBG MONITORING, ED: Glucose-Capillary: 217 mg/dL — ABNORMAL HIGH (ref 70–99)

## 2015-01-18 MED ORDER — CEPHALEXIN 250 MG PO CAPS: 500.0000 mg | ORAL_CAPSULE | Freq: Once | ORAL | Status: DC

## 2015-01-18 MED ORDER — CEPHALEXIN 500 MG PO CAPS: 500.0000 mg | ORAL_CAPSULE | Freq: Four times a day (QID) | ORAL | Status: DC

## 2015-01-18 MED ORDER — IBUPROFEN 400 MG PO TABS
600.0000 mg | ORAL_TABLET | Freq: Once | ORAL | Status: AC
  Administered 2015-01-18: 600 mg via ORAL
  Filled 2015-01-18 (×2): qty 1

## 2015-01-18 NOTE — ED Provider Notes (Signed)
CSN: [id number]     Arrival date & time 01/18/15  0232 History  This chart was scribed for Bethany Rhineonald Karina Lenderman, MD by Bronson CurbJacqueline [name], ED Scribe. This patient was seen in room B16C/B16C and the patient's care was started at 3:52 AM.   Chief Complaint  Patient presents with  . Back Pain    Patient is a 34 y.o. [name] presenting with back pain. The history is provided by the patient.  Back Pain Location:  Lumbar spine Quality:  Aching Radiates to:  Does not radiate Pain severity now: 8/10. Pain is:  Same all the time Onset quality:  Unable to specify Duration:  3 days Timing:  [name] Progression:  Unchanged Chronicity:  New Relieved by:  None tried Worsened by:  Nothing tried Ineffective treatments:  None tried Associated symptoms: no abdominal pain, no bladder incontinence, no bowel incontinence, no chest pain and no fever      HPI Comments: [name] is a 34 y.o. [name], with history of DM, who presents to the Emergency Department complaining of [name], 8/10, aching lower back pain for the past 3 days. There is associated chills and difficulty urinating with decreased urinary output. She denies history of back surgery, but is unsure of history of kidney stones or other kidney problems. She denies bowel/bladder incontinence, fever, vomiting, chest pain, or abdominal pain. Patient is a nonsmoker and denies illicit substance abuse.    Past Medical History  Diagnosis Date  . Diabetes mellitus without complication     on Metformin   Past Surgical History  Procedure Laterality Date  . Laparoscopic bilateral salpingectomy Bilateral 07/19/2014    Procedure: LAPAROSCOPIC BILATERAL SALPINGECTOMY;  Surgeon: Bethany AntiguaPeggy Constant, MD;  Location: [facility];  Service: Gynecology;  Laterality: Bilateral;   Family History  Problem Relation Age of Onset  . Diabetes Mother    History  Substance Use Topics  . Smoking status: Never Smoker   . Smokeless tobacco: Never Used  . Alcohol Use: No   OB  History    Gravida Para Term Preterm AB TAB SAB Ectopic Multiple Living   2 2 2       2      Review of Systems  Constitutional: Positive for chills. Negative for fever.  Cardiovascular: Negative for chest pain.  Gastrointestinal: Negative for nausea, vomiting, abdominal pain and bowel incontinence.  Genitourinary: Positive for difficulty urinating. Negative for bladder incontinence.  Musculoskeletal: Positive for back pain.      Allergies  Review of patient's allergies indicates no known allergies.  Home Medications   Prior to Admission medications   Medication Sig Start Date End Date Taking? Authorizing Provider  docusate sodium (COLACE) 100 MG capsule Take 1 capsule (100 mg total) by mouth 2 (two) times daily as needed. Patient not taking: Reported on 08/08/2014 07/19/14   Bethany AntiguaPeggy Constant, MD  ibuprofen (ADVIL,MOTRIN) 600 MG tablet Take 1 tablet (600 mg total) by mouth every 6 (six) hours as needed. Patient not taking: Reported on 08/08/2014 07/19/14   Bethany AntiguaPeggy Constant, MD  metFORMIN (GLUCOPHAGE) 500 MG tablet Take 1 tablet (500 mg total) by mouth 2 (two) times daily with a meal. 06/14/14   Bethany Lyonsouglas Delo, MD  oxyCODONE-acetaminophen (PERCOCET/ROXICET) 5-325 MG per tablet Take 1 tablet by mouth every 4 (four) hours as needed. Patient not taking: Reported on 08/08/2014 07/19/14   Bethany AntiguaPeggy Constant, MD   Triage Vitals: BP 135/83 mmHg  Pulse 96  Temp(Src) 97.6 F (36.4 C) (Oral)  Resp 18  Ht 5\' 2"  (1.575 m)  Wt 120 lb (54.432 kg)  BMI 21.94 kg/m2  SpO2 100%  LMP 01/12/2015  Physical Exam  Nursing note and vitals reviewed. CONSTITUTIONAL: Well developed/well nourished HEAD: Normocephalic/atraumatic EYES: EOMI/PERRL ENMT: Mucous membranes moist NECK: supple no meningeal signs SPINE/BACK:entire spine nontender. No bruising/crepitance/stepoffs noted to spine CV: S1/S2 noted, no murmurs/rubs/gallops noted LUNGS: Lungs are clear to auscultation bilaterally, no apparent  distress ABDOMEN: soft, nontender, no rebound or guarding GU:no cva tenderness NEURO: Awake/alert, equal motor 5/5 strength noted with the following: hip flexion/knee flexion/extension, foot dorsi/plantar flexion, great toe extension intact bilaterally, no clonus bilaterally, plantar reflex appropriate (toes downgoing), no sensory deficit in any dermatome.  Equal patellar/achilles reflex noted (2+) in bilateral lower extremities.  Pt is able to ambulate unassisted. EXTREMITIES: pulses normal, full ROM SKIN: warm, color normal PSYCH: no abnormalities of mood noted, alert and oriented to situation   ED Course  Procedures  Medications  cephALEXin (KEFLEX) capsule 500 mg (not administered)  ibuprofen (ADVIL,MOTRIN) tablet 600 mg (600 mg Oral Given 01/18/15 0424)    DIAGNOSTIC STUDIES: Oxygen Saturation is 100% on room air, normal by my interpretation.    COORDINATION OF CARE: At [id number] Discussed treatment plan with patient which includes UA. Patient agrees.   Labs Review Labs Reviewed  URINALYSIS, ROUTINE W REFLEX MICROSCOPIC - Abnormal; Notable for the following:    APPearance CLOUDY (*)    Hgb urine dipstick LARGE (*)    Protein, ur 30 (*)    Leukocytes, UA LARGE (*)    All other components within normal limits  URINE MICROSCOPIC-ADD ON - Abnormal; Notable for the following:    Bacteria, UA FEW (*)    All other components within normal limits  CBG MONITORING, ED - Abnormal; Notable for the following:    Glucose-Capillary 217 (*)    All other components within normal limits  POC URINE PREG, ED    UTI noted Pt well appearing She is not septic appearing Discussed strict return precautions She speaks fluent english (main language listed as [place]namese) MDM   Final diagnoses:  UTI (lower urinary tract infection)  Hyperglycemia    Nursing notes including past medical history and social history reviewed and considered in documentation Labs/vital reviewed myself and considered  during evaluation    I personally performed the services described in this documentation, which was scribed in my presence. The recorded information has been reviewed and is accurate.      Bethany Rhineonald Ashyra Cantin, MD 01/18/15 [phone]

## 2015-01-18 NOTE — ED Notes (Signed)
Dr. Wickline at bedside.  

## 2015-01-18 NOTE — ED Notes (Signed)
Patient was discharged and walked to the waiting room with this RN prior to receiving Keflex medication.  MD Wickline made aware.

## 2015-01-18 NOTE — ED Notes (Signed)
Pt c/o of lower back pain and difficulty urinating. Pt states that when she uses the bathroom it is only a little bit.

## 2015-06-21 ENCOUNTER — Encounter (HOSPITAL_COMMUNITY): Payer: Self-pay | Admitting: Emergency Medicine

## 2015-06-21 ENCOUNTER — Emergency Department (INDEPENDENT_AMBULATORY_CARE_PROVIDER_SITE_OTHER)
Admission: EM | Admit: 2015-06-21 | Discharge: 2015-06-21 | Disposition: A | Discharge status: Home / Self Care | Payer: 59 | Source: Home / Self Care | Attending: Family Medicine | Admitting: Family Medicine

## 2015-06-21 ENCOUNTER — Other Ambulatory Visit (HOSPITAL_COMMUNITY)
Admission: RE | Admit: 2015-06-21 | Discharge: 2015-06-21 | Disposition: A | Discharge status: Home / Self Care | Payer: 59 | Source: Ambulatory Visit | Attending: Family Medicine | Admitting: Family Medicine

## 2015-06-21 DIAGNOSIS — E119 Type 2 diabetes mellitus without complications: Secondary | ICD-10-CM

## 2015-06-21 DIAGNOSIS — N390 Urinary tract infection, site not specified: Secondary | ICD-10-CM

## 2015-06-21 LAB — POCT URINALYSIS DIP (DEVICE)
Bilirubin Urine: NEGATIVE
GLUCOSE, UA: 500 mg/dL — AB
KETONES UR: NEGATIVE mg/dL
Nitrite: NEGATIVE
PH: 6.5 (ref 5.0–8.0)
PROTEIN: NEGATIVE mg/dL
Specific Gravity, Urine: 1.015 (ref 1.005–1.030)
Urobilinogen, UA: 0.2 mg/dL (ref 0.0–1.0)

## 2015-06-21 LAB — POCT I-STAT, CHEM 8
BUN: 10 mg/dL (ref 6–20)
CALCIUM ION: 1.25 mmol/L — AB (ref 1.12–1.23)
CHLORIDE: 100 mmol/L — AB (ref 101–111)
Creatinine, Ser: 0.7 mg/dL (ref 0.44–1.00)
Glucose, Bld: 240 mg/dL — ABNORMAL HIGH (ref 65–99)
HCT: 43 % (ref 36.0–46.0)
Hemoglobin: 14.6 g/dL (ref 12.0–15.0)
Potassium: 4 mmol/L (ref 3.5–5.1)
SODIUM: 138 mmol/L (ref 135–145)
TCO2: 28 mmol/L (ref 0–100)

## 2015-06-21 LAB — POCT PREGNANCY, URINE: Preg Test, Ur: NEGATIVE

## 2015-06-21 MED ORDER — CEPHALEXIN 500 MG PO CAPS: 500.0000 mg | ORAL_CAPSULE | Freq: Three times a day (TID) | ORAL | Status: DC

## 2015-06-21 MED ORDER — FLUCONAZOLE 150 MG PO TABS: 150.0000 mg | ORAL_TABLET | Freq: Every day | ORAL | Status: DC

## 2015-06-21 MED ORDER — METFORMIN HCL 500 MG PO TABS
500.0000 mg | ORAL_TABLET | Freq: Two times a day (BID) | ORAL | Status: DC
Start: 2015-06-21 — End: 2016-01-23

## 2015-06-21 NOTE — Discharge Instructions (Signed)
You likely have a urinary tract infection. Please start the antibiotics prescribed. Please drink plenty of fluids. Please use the Diflucan if you develop a yeast infection. Please restart your metformin for your diabetes. Your sugar was very high today. This is likely the cause of many of your symptoms. Please follow-up with your primary care physician.  B?n c kh? n?ng b? nhi?m trng ???ng ti?t ni?u. Hy b?t ??u cc thu?c khng sinh theo quy ??nh. Hy u?ng nhi?u n??c. Vui lng s? d?ng cc Diflucan n?u b?n pht tri?n m?t nhi?m trng n?m men. Hy kh?i ??ng l?i metformin cho b?nh ti?u ???ng c?a b?n. ???ng c?a b?n l r?t cao nh? hi?n nay. ?y c th? l nguyn nhn gy ra nhi?u tri?u ch?ng c?a b?n. Hy theo di v?i bc s? ch?m  chnh c?a b?n.

## 2015-06-21 NOTE — ED Provider Notes (Signed)
CSN: [id number]     Arrival date & time 06/21/15  1801 History   First MD Initiated Contact with Patient 06/21/15 1912     Chief Complaint  Patient presents with  . Urinary Tract Infection   (Consider location/radiation/quality/duration/timing/severity/associated sxs/prior Treatment) HPI  Chills and dysuria. Started today. Frequency. Has not tried anything for symptoms.    Out of metformin for past year. Does not check glucose. Pt trying to get established at new pcp but just got insurance again.   Denies fevers, nausea, vomiting, vaginal discharge, chest pain, shortness breath, palpitations, LOC, headache.     Past Medical History  Diagnosis Date  . Diabetes mellitus without complication (HCC)     on Metformin   Past Surgical History  Procedure Laterality Date  . Laparoscopic bilateral salpingectomy Bilateral 07/19/2014    Procedure: LAPAROSCOPIC BILATERAL SALPINGECTOMY;  Surgeon: Catalina Antigua, MD;  Location: [facility];  Service: Gynecology;  Laterality: Bilateral;   Family History  Problem Relation Age of Onset  . Diabetes Mother    Social History  Substance Use Topics  . Smoking status: Never Smoker   . Smokeless tobacco: Never Used  . Alcohol Use: No   OB History    Gravida Para Term Preterm AB TAB SAB Ectopic Multiple Living   Review of Systems Per HPI with all other pertinent systems negative.   Allergies  Review of patient's allergies indicates no known allergies.  Home Medications   Prior to Admission medications   Medication Sig Start Date End Date Taking? Authorizing Provider  cephALEXin (KEFLEX) 500 MG capsule Take 1 capsule (500 mg total) by mouth 3 (three) times daily. 06/21/15   Ozella Rocks, MD  fluconazole (DIFLUCAN) 150 MG tablet Take 1 tablet (150 mg total) by mouth daily. Repeat dose in 3 days 06/21/15   Ozella Rocks, MD  metFORMIN (GLUCOPHAGE) 500 MG tablet Take 1 tablet (500 mg total) by mouth 2 (two) times daily with a  meal. 06/21/15   Ozella Rocks, MD   Meds Ordered and Administered this Visit  Medications - No data to display  BP 123/83 mmHg  Pulse 90  Temp(Src) 98.8 F (37.1 C) (Oral)  Resp 14  SpO2 99%  LMP 06/09/2015 No data found.   Physical Exam Physical Exam  Constitutional: oriented to person, place, and time. appears well-developed and well-nourished. No distress.  HENT:  Head: Normocephalic and atraumatic.  Eyes: EOMI. PERRL.  Neck: Normal range of motion.  Cardiovascular: RRR, no m/r/g, 2+ distal pulses,  Pulmonary/Chest: Effort normal and breath sounds normal. No respiratory distress.  Abdominal: Soft. Bowel sounds are normal. NonTTP, no distension.  Musculoskeletal: Normal range of motion. Non ttp, no effusion.  mild left CVA tenderness  Neurological: alert and oriented to person, place, and time.  Skin: Skin is warm. No rash noted. non diaphoretic.  Psychiatric: normal mood and affect. behavior is normal. Judgment and thought content normal.   ED Course  Procedures (including critical care time)  Labs Review Labs Reviewed  POCT URINALYSIS DIP (DEVICE) - Abnormal; Notable for the following:    Glucose, UA 500 (*)    Hgb urine dipstick TRACE (*)    Leukocytes, UA SMALL (*)    All other components within normal limits  POCT I-STAT, CHEM 8 - Abnormal; Notable for the following:    Chloride 100 (*)    Glucose, Bld 240 (*)    Calcium,  Ion 1.25 (*)    All other components within normal limits  POCT PREGNANCY, URINE    Imaging Review No results found.   Visual Acuity Review  Right Eye Distance:   Left Eye Distance:   Bilateral Distance:    Right Eye Near:   Left Eye Near:    Bilateral Near:         MDM   1. UTI (lower urinary tract infection)   2. Diabetes mellitus without complication (HCC)    Hyperglycemia secondary to uncontrolled diabetes. No evidence of hyperosmolar ketotic state. Patient to restart metformin. Renal function normal. Patient  exhibiting symptoms of UTI possible [place] though urine is fairly unremarkable. Will send urine culture. Patient start Keflex. Diflucan if develops yeast infection. Patient follow-up with your primary care physician sometime in the next couple of weeks per patient report.    Ozella Rocks, MD 06/21/15 [phone]

## 2015-06-21 NOTE — ED Notes (Signed)
Patient reports pain with urination, low abdominal pain, low back pain.  Patient reports she is diabetic and has not had medicine for one year.

## 2015-06-24 LAB — URINE CULTURE: Culture: >=100000

## 2015-06-26 NOTE — ED Notes (Signed)
staphylococcus probable contaminant

## 2015-11-15 ENCOUNTER — Ambulatory Visit: Payer: Self-pay | Admitting: Internal Medicine

## 2016-01-23 ENCOUNTER — Encounter: Payer: Self-pay | Admitting: Internal Medicine

## 2016-01-23 ENCOUNTER — Encounter: Payer: Self-pay | Admitting: Emergency Medicine

## 2016-01-23 ENCOUNTER — Ambulatory Visit (INDEPENDENT_AMBULATORY_CARE_PROVIDER_SITE_OTHER): Payer: 59 | Admitting: Internal Medicine

## 2016-01-23 ENCOUNTER — Other Ambulatory Visit (INDEPENDENT_AMBULATORY_CARE_PROVIDER_SITE_OTHER): Payer: 59

## 2016-01-23 VITALS — BP 128/94 | HR 97 | Temp 97.6°F | Resp 16 | Wt 129.0 lb

## 2016-01-23 DIAGNOSIS — E11319 Type 2 diabetes mellitus with unspecified diabetic retinopathy without macular edema: Secondary | ICD-10-CM

## 2016-01-23 DIAGNOSIS — R300 Dysuria: Secondary | ICD-10-CM

## 2016-01-23 LAB — COMPREHENSIVE METABOLIC PANEL
ALT: 20 U/L (ref 0–35)
AST: 12 U/L (ref 0–37)
Albumin: 4.4 g/dL (ref 3.5–5.2)
Alkaline Phosphatase: 52 U/L (ref 39–117)
BILIRUBIN TOTAL: 0.4 mg/dL (ref 0.2–1.2)
BUN: 13 mg/dL (ref 6–23)
CO2: 27 meq/L (ref 19–32)
CREATININE: 0.58 mg/dL (ref 0.40–1.20)
Calcium: 9.9 mg/dL (ref 8.4–10.5)
Chloride: 98 mEq/L (ref 96–112)
GFR: 125.33 mL/min (ref >60.00)
GLUCOSE: 282 mg/dL — AB (ref 70–99)
Potassium: 4.5 mEq/L (ref 3.5–5.1)
Sodium: 134 mEq/L — ABNORMAL LOW (ref 135–145)
Total Protein: 8.1 g/dL (ref 6.0–8.3)

## 2016-01-23 LAB — URINALYSIS, ROUTINE W REFLEX MICROSCOPIC
BILIRUBIN URINE: NEGATIVE
Hgb urine dipstick: NEGATIVE
Ketones, ur: NEGATIVE
NITRITE: NEGATIVE
PH: 6 (ref 5.0–8.0)
RBC / HPF: NONE SEEN (ref 0–?)
Specific Gravity, Urine: 1.015 (ref 1.000–1.030)
Urine Glucose: >=1000 — AB
Urobilinogen, UA: 0.2 (ref 0.0–1.0)

## 2016-01-23 LAB — HEMOGLOBIN A1C: Hgb A1c MFr Bld: 8.9 % — ABNORMAL HIGH (ref 4.6–6.5)

## 2016-01-23 LAB — CBC WITH DIFFERENTIAL/PLATELET
Basophils Absolute: 0.1 10*3/uL (ref 0.0–0.1)
Basophils Relative: 0.6 % (ref 0.0–3.0)
EOS PCT: 0.7 % (ref 0.0–5.0)
Eosinophils Absolute: 0.1 10*3/uL (ref 0.0–0.7)
HCT: 42.1 % (ref 36.0–46.0)
Hemoglobin: 14.1 g/dL (ref 12.0–15.0)
LYMPHS ABS: 5.1 10*3/uL — AB (ref 0.7–4.0)
Lymphocytes Relative: 40.8 % (ref 12.0–46.0)
MCHC: 33.6 g/dL (ref 30.0–36.0)
MCV: 82.7 fl (ref 78.0–100.0)
MONO ABS: 0.7 10*3/uL (ref 0.1–1.0)
MONOS PCT: 5.6 % (ref 3.0–12.0)
NEUTROS PCT: 52.3 % (ref 43.0–77.0)
Neutro Abs: 6.6 10*3/uL (ref 1.4–7.7)
Platelets: 365 10*3/uL (ref 150.0–400.0)
RBC: 5.1 Mil/uL (ref 3.87–5.11)
RDW: 13 % (ref 11.5–15.5)
WBC: 12.6 10*3/uL — ABNORMAL HIGH (ref 4.0–10.5)

## 2016-01-23 LAB — MICROALBUMIN / CREATININE URINE RATIO
Creatinine,U: 66.3 mg/dL
MICROALB UR: 14.4 mg/dL — AB (ref 0.0–1.9)
Microalb Creat Ratio: 21.7 mg/g (ref 0.0–30.0)

## 2016-01-23 LAB — TSH: TSH: 1.42 u[IU]/mL (ref 0.35–4.50)

## 2016-01-23 MED ORDER — METFORMIN HCL 500 MG PO TABS: 500.0000 mg | ORAL_TABLET | Freq: Two times a day (BID) | ORAL | Status: DC

## 2016-01-23 NOTE — Progress Notes (Signed)
Subjective:    Patient ID: [name], female    DOB: 03/31/1981, 35 y.o.   MRN: [id number]  HPI She is here to establish with a new pcp. Her son is here with her.  There is also an interpreter because she does not speak [place].   Diabetes: She is taking her medication daily as prescribed - she has been out for one week. She is compliant with a diabetic diet. She is walking a lot at work and is active at work, but does not exercise outside of work. She does not monitor her sugars at home.   She is up-to-date with an ophthalmology examination - the eye doctor told her she may a problem with her retina and she needs a referral to a specialist today. She has seen a nutritionist in the past.    She has pain with urination - she gets a craming with urination.  There may be some frequency of urination at times.  She denies hematuria.  She denies abdominal pain.       Medications and allergies reviewed with patient and updated if appropriate.  Patient Active Problem List   Diagnosis Date Noted  . Encounter for sterilization 07/19/2014  . Diabetes (HCC) 08/07/2013  . NONSPEC REACT TUBERCULIN SKIN TEST W/O ACTIVE TB 10/25/2010  . LIVER FUNCTION TESTS, ABNORMAL, HX OF 07/13/2010  . FATIGUE 07/12/2010    Current Outpatient Prescriptions on File Prior to Visit  Medication Sig Dispense Refill  . metFORMIN (GLUCOPHAGE) 500 MG tablet Take 1 tablet (500 mg total) by mouth 2 (two) times daily with a meal. 60 tablet 1   No current facility-administered medications on file prior to visit.    Past Medical History  Diagnosis Date  . Diabetes mellitus without complication (HCC)     on Metformin    Past Surgical History  Procedure Laterality Date  . Laparoscopic bilateral salpingectomy Bilateral 07/19/2014    Procedure: LAPAROSCOPIC BILATERAL SALPINGECTOMY;  Surgeon: Catalina Antigua, MD;  Location: [facility];  Service: Gynecology;  Laterality: Bilateral;    Social History   Social History  .  Marital Status: Single    Spouse Name: N/A  . Number of Children: N/A  . Years of Education: N/A   Social History Main Topics  . Smoking status: Never Smoker   . Smokeless tobacco: Never Used  . Alcohol Use: No  . Drug Use: No  . Sexual Activity: Not Currently    Birth Control/ Protection: Surgical   Other Topics Concern  . None   Social History Narrative    Family History  Problem Relation Age of Onset  . Diabetes Mother     Review of Systems  Constitutional: Negative for fever and appetite change.  Eyes: Positive for visual disturbance (blurry vision).  Respiratory: Negative for cough, shortness of breath and wheezing.   Cardiovascular: Negative for chest pain, palpitations and leg swelling.  Gastrointestinal: Negative for nausea and abdominal pain.       No gerd  Musculoskeletal: Positive for back pain (lower back pain - ? work).  Neurological: Negative for dizziness, light-headedness and headaches.  Psychiatric/Behavioral: Negative for dysphoric mood. The patient is not nervous/anxious.        Objective:   Filed Vitals:   01/23/16 1505  BP: 128/94  Pulse: 97  Temp: 97.6 F (36.4 C)  Resp: 16   Filed Weights   01/23/16 1505  Weight: 129 lb (58.514 kg)   Body mass index is 23.59 kg/(m^2).  Physical Exam  Constitutional: She appears well-developed and well-nourished. No distress.  HENT:  Head: Normocephalic and atraumatic.  Right Ear: External ear normal. Normal ear canal and TM Left Ear: External ear normal.  Normal ear canal and TM Mouth/Throat: Oropharynx is clear and moist.  Eyes: Conjunctivae normal.  Neck: Neck supple. No tracheal deviation present. No thyromegaly present.  No carotid bruit  Cardiovascular: Normal rate, regular rhythm and normal heart sounds.   No murmur heard.  No edema. Pulmonary/Chest: Effort normal and breath sounds normal. No respiratory distress. She has no wheezes. She has no rales.  Abdominal: Soft. She exhibits no  distension. There is no tenderness.  Lymphadenopathy: She has no cervical adenopathy.  Skin: Skin is warm and dry. She is not diaphoretic.  Psychiatric: She has a normal mood and affect. Her behavior is normal.         Assessment & Plan:   See Problem List for Assessment and Plan of chronic medical problems.  At work she sometimes needs to take a break for urinate or if her legs feel weak - ? Related to diabetes.  She has asked for a note for her boss so he understands and will allow her to do this.  Will give a note today -- blood work and further evaluation needed of diabetes   Follow up in 3 months

## 2016-01-23 NOTE — Progress Notes (Signed)
Pre visit review using our clinic review tool, if applicable. No additional management support is needed unless otherwise documented below in the visit note. 

## 2016-01-23 NOTE — Assessment & Plan Note (Signed)
It is difficulty to get a good history through an interpreter Check Ua, Ucx to rule out an infection Has some increased frequency ? - May be related to poorly controlled diabetes

## 2016-01-23 NOTE — Patient Instructions (Addendum)
Test(s) ordered today. Your results will be released to MyChart (or called to you) after review, usually within 72hours after test completion. If any changes need to be made, you will be notified at that same time.   No immunizations administered today.   Medications reviewed and updated.  No changes recommended at this time.  Your prescription(s) have been submitted to your pharmacy. Please take as directed and contact our office if you believe you are having problem(s) with the medication(s).  A referral ws ordered for an eye doctor.   Please followup in 3 months  B?nh ti?u ???ng tp 2, Ng??i l?n (Type 2 Diabetes Mellitus, Adult) B?nh ti?u ???ng tp 2, th??ng g?i ??n gi?n l ti?u ???ng tp 2, l m?t b?nh ko di (m?n tnh). Trong ti?u ???ng tp 2, tuy?n t?y khng s?n xu?t ?? insulin (hocmon), cc t? bo t ?p ?ng v?i insulin lm cho ( khng insulin), ho?c c? hai. Thng th??ng, insulin v?n chuy?n ???ng t? th?c ?n vo cc t? bo ? m. Cc t? bo ? m s? d?ng ???ng ?? s?n sinh ra n?ng l??ng. Thi?u h?t insulin ho?c khng ?p ?ng bnh th??ng v?i insulin gy ra l??ng ???ng d? th?a tch t? trong mu thay v ?i vo cc t? bo ? m. K?t qu? l, lm cho l??ng ???ng trong mu cao (t?ng ???ng huy?t). ?nh h??ng c?a hm l??ng ???ng (glucose) cao c th? gy ra nhi?u bi?n ch?ng.  B?nh ti?u ???ng tp 2 tr??c ?y cn ???c g?i l b?nh ti?u ???ng kh?i pht ? ng??i l?n, nh?ng n c th? x?y ra ? b?t c? l?a tu?i no.  CC Y?U T? NGUY C?  M?t ng??i d? b? b?nh ti?u ???ng tp 2 n?u c ai ? trong gia ?nh b? b?nh ny, ??ng th?i c m?t ho?c nhi?u y?u t? nguy c? chnh sau ?y:  T?ng cn ho?c th?a cn ho?c bo ph.  L?i s?ng t ho?t ??ng.  Ti?n s? lin t?c ?n th?c ?n nhi?u n?ng l??ng. Duy tr cn n?ng bnh th??ng v ho?t ??ng thn th? th??ng xuyn c th? lm gi?m nguy c? pht tri?n b?nh ti?u ???ng tp 2. TRI?U CH?NG  Ban ??u, m?t ng??i b? b?nh ti?u ???ng tp 2 c th? khng c cc tri?u ch?ng. Cc tri?u ch?ng c?a  b?nh ti?u ???ng tp 2 xu?t hi?n t? t?. Cc tri?u ch?ng bao g?m:  Kht n??c nhi?u (ch?ng kht nhi?u).  Ti?u ti?n nhi?u (?a ni?u).  ?i ti?u nhi?u vo ban ?m (ti?u ?m).  Thay ??i cn n?ng m?t cch ??t ng?t ho?c khng r nguyn nhn.  Th??ng xuyn b? nhi?m trng ti pht.  M?t m?i (m?t)  Y?u.  Thay ??i th? l?c, ch?ng h?n nh? nhn m?.  Mi tri cy trong h?i th? c?a qu v?.  ?au b?ng.  Bu?n nn ho?c nn m?a.  V?t c?t ho?c v?t b?m tm lu lnh.  ?au bu?t ho?c t ? bn tay ho?c bn chn.  V?t th??ng h? trn da (lot). CH?N ?ON B?nh ti?u ???ng tp 2 th??ng khng ???c ch?n ?on cho ??n khi xu?t hi?n cc bi?n ch?ng c?a b?nh ti?u ???ng. B?nh ti?u ???ng tp 2 ???c ch?n ?on khi xu?t hi?n cc tri?u ch?ng c?ng nh? bi?n ch?ng v khi l??ng ???ng huy?t t?ng. L??ng ???ng huy?t c th? ???c ki?m tra b?ng m?t ho?c nhi?u xt nghi?m mu sau ?y:  Xt nghi?m ???ng huy?t lc ?i. Qu v? s? khng ???c php ?n trong t nh?t l  8 ti?ng tr??c khi l?y m?u mu.  Xt nghi?m ???ng huy?t ng?u nhin. ???ng huy?t ???c xt nghi?m b?t k? lc no trong ngy, b?t k? qu v? ?n lc no.  Xt nghi?m ???ng huy?t A1c hemoglobin. Xt nghi?m A1c hemoglobin cung c?p thng tin v? vi?c ki?m sot ???ng huy?t trong 3 thng tr??c ?.  Xt nghi?m dung n?p glucose theo ???ng u?ng (OGTT). ???ng huy?t c?a qu v? ???c ?o sau khi qu v? ch?a ?n (nh?n ?n) trong 2 gi? v sau ? l sau khi qu v? u?ng ?? u?ng c ch?a glucose. ?I?U TR?   Qu v? c th? c?n dng insulin ho?c thu?c tr? ti?u ???ng hng ngy ?? gi? cho l??ng ???ng huy?t trong ph?m vi mong mu?n.  N?u qu v? dng insulin, qu v? c th? c?n ?i?u ch?nh li?u thu?c ty thu?c vo l??ng carbohydrate m qu v? ?n trong m?i b?a ?n chnh ho?c b?a ?n nh?.  Thay ??i l?i s?ng ???c khuy?n ngh? l m?t ph?n trong bi?n php ?i?u tr? c?a qu v?. Nh?ng thay ??i ny c th? bao g?m:  Tun theo m?t ch? ?? ?n c nhn ha do m?t chuyn gia dinh d??ng ??a ra.  T?p th? d?c hng ngy. Chuyn  gia ch?m [place] s?c kh?e s? ??t cc m?c tiu ?i?u tr? c nhn ha cho qu v? d?a theo tu?i, cc lo?i thu?c c?a qu v?, th?i gian qu v? ? b? ti?u t??ng, v b?t k? tnh tr?ng b?nh l no khc m qu v? c. Thng th??ng, m?c tiu ?i?u tr? l duy tr n?ng ?? glucose trong mu:  Tr??c khi ?n (tr??c b?a ?n): 80-130 mg/dL.  Sau khi ?n (sau b?a ?n): d??i 180 mg/dL.  A1c: th?p h?n 6,5-7%. H??NG D?N CH?M [place] T?I NH   L??ng A1c hemoglobin c?a qu v? ???c ki?m tra hai l?n m?i n?m.  Th?c hi?n vi?c theo di ???ng huy?t hng ngy theo ch? d?n c?a chuyn gia ch?m [place] s?c kh?e.  Theo di [place] trong n??c ti?u khi qu v? b? b?nh v theo ch? d?n c?a chuyn gia ch?m [place] s?c kh?e.  S? d?ng thu?c tr? ti?u ???ng ho?c insulin theo ch? d?n c?a chuyn gia ch?m [place] s?c kh?e ?? duy tr l??ng ???ng huy?t trong ph?m vi mong mu?n.  Khng bao gi? ?? h?t thu?c tr? ti?u ???ng ho?c insulin. Thu?c c?n ph?i dng hng ngy.  N?u qu v? ?ang dng insulin, qu v? c th? ?i?u ch?nh l??ng insulin d?a vo l??ng carbohydrates qu v? ?n. Carbohydrate c th? lm t?ng l??ng ???ng huy?t nh?ng c?n ph?i bao g?m trong ch? ?? ?n u?ng c?a qu v?. Carbohydrate cung c?p vitamin, khong ch?t v ch?t x?, l m?t ph?n thi?t y?u c?a ch? ?? ?n u?ng c l?i cho s?c kh?e. Carbohydrate ???c tm th?y trong tri cy, rau, ng? c?c, cc s?n ph?m t? s?a, cc lo?i ??u v cc lo?i th?c ph?m c b? sung thm ???ng.  ?n th?c ?n c l?i cho s?c kh?e. Qu v? c?n h?n g?p m?t chuyn gia dinh d??ng c ??ng k hnh ngh? ?? gip qu v? ??a ra m?t k? ho?ch ?n u?ng ph h?p.  Gi?m cn n?u qu v? th?a cn.  Mang theo th? c?nh bo y t? ho?c ?eo ?? trang s?c c c?nh bo y t?.  Mang theo ?? ?n nh? ch?a 15 gam carbohydrate m?i lc ?? ?i?u tr? h? ???ng huy?t (h? ???ng huy?t). M?t s? v d? v? ?? ?n nh?  ch?a 15 gam carbohydrate bao g?m:  Vin glucose, 3 ho?c 4.  Gel glucose, ?ng 15 gam.  Nho kh, 2 mu?ng (24 gam).  Th?ch hnh h?t ??u, 6.  Bnh quy hnh con gi?ng,  8.  N??c u?ng c ga thng th??ng, 4 aox? (120 ml)  K?o chp chp, 9.  Nh?n bi?t h? ???ng huy?t. H? ???ng huy?t x?y ra khi l??ng ???ng huy?t t? 70 mg/dL tr? xu?ng. Nguy c? h? ???ng huy?t gia t?ng khi nh?n ?n ho?c b? b?a, trong v sau khi t?p th? d?c c??ng ?? cao v trong khi ng?. Cc tri?u ch?ng h? ???ng huy?t c th? bao g?m:  Run ho?c l?c.  Gi?m kh? n?ng t?p trung.  ?? m? hi.  Nh?p tim t?ng.  ?au ??u.  Kh mi?ng.  ?i.  D? b? kch thch.  Lo u.  Ng? khng yn.  Thay ??i l?i ni ho?c s? ph?i h?p.  B? l l?n.  ?i?u tr? h? ???ng huy?t k?p th?i. N?u qu v? t?nh to v c th? nu?t m?t cch an ton, hy theo quy t?c 15:15:  Dng 15-20 gam glucose ho?c carbohydrate c tc d?ng nhanh. L?a ch?n tc ??ng nhanh bao g?m gel glucose, vin glucose ho?c 4 aox? (120 ml) n??c p tri cy, soda bnh th??ng ho?c s?a t bo.  Ki?m tra l??ng ???ng huy?t c?a qu v? 15 pht sau khi u?ng glucose.  Dng t? 15-20 gam glucose tr? ln n?u l??ng ???ng huy?t ???c ?o l?i v?n ? m?c 70 mg/dL tr? xu?ng.  ?n theo b?a ?n bnh th??ng ho?c ?? ?n nh? trong vng 1 ti?ng sau khi l??ng ???ng huy?t tr? l?i bnh th??ng.  Hy c?nh gic v?i c?m gic r?t kht v ?i ti?u ti?n nhi?u l?n h?n bnh th??ng v ?y l nh?ng d?u hi?u s?m c?a t?ng ???ng huy?t. Vi?c pht hi?n t?ng ???ng huy?t s?m cho php ?i?u tr? k?p th?i. ?i?u tr? t?ng ???ng huy?t theo ch? d?n c?a chuyn gia ch?m [place] s?c kh?e.  M?i tu?n tham gia vo t nh?t 150 pht ho?t ??ng thn th? v?i c??ng ?? trung bnh, phn b? trong t nh?t 3 ngy trong tu?n ho?c theo ch? d?n c?a chuyn gia ch?m [place] s?c kh?e. Ngoi ra, qu v? nn tham gia vo bi t?p c s?c c?n t nh?t 2 l?n m?t tu?n ho?c theo ch? d?n c?a chuyn gia ch?m [place] s?c kh?e. C? g?ng dnh khng qu 90 pht m?i l?n khng ho?t ??ng.  ?i?u ch?nh thu?c v l??ng th?c ?n khi c?n n?u qu v? b?t ??u m?t bi t?p ho?c m?t mn th? thao m?i.  Lm theo k? ho?ch trong ngy b? b?nh c?a qu v? b?t c? lc no m qu v?  khng th? ?n ho?c u?ng nh? bnh th??ng.  Khng s? d?ng cc s?n ph?m thu?c l bao g?m thu?c l ht, thu?c l d?ng nhai ho?c thu?c l ?i?n t?. N?u qu v? c?n gip ?? ?? cai thu?c, hy h?i chuyn gia ch?m [place] s?c kh?e.  Gi?i h?n l??ng r??u qu v? u?ng khng qu 1 ly m?i ngy v?i ph? n? khng mang thai v 2 ly m?i ngy v?i nam gi?i. Qu v? ch? nn u?ng r??u khi ?n. Ni chuy?n v?i chuyn gia ch?m [place] s?c kh?e xem u?ng r??u c an ton cho qu v? hay khng. Cho chuyn gia ch?m [place] s?c kh?e bi?t n?u qu v? u?ng r??u vi l?n m?i tu?n.  Tun th? m?i cu?c h?n khm l?i theo  ch? d?n c?a chuyn gia ch?m [place] s?c kh?e. ?i?u ny l quan tr?ng.  S?p x?p bu?i khm m?t ngay sau khi ch?n ?on b?nh ti?u ???ng tp 2 v sau ? l hng n?m.  Th?c hi?n ch?m [place] da v bn chn hng ngy. Ki?m tra da v bn chn hng ngy xem c v?t c?t, v?t b?m tm, t?y ??, v?n ?? v? mng, ch?y mu, m?n n??c hay l? lot khng. Bn chn c?n ???c chuyn gia ch?m [place] s?c kh?e khm hng n?m.  ?nh r?ng v l?i t nh?t hai l?n m?i ngy v dng ch? nha khoa t nh?t m?t l?n m?i ngy. G?p nha s? ?? khm l?i th??ng xuyn.  Chia s? k? ho?ch qu?n l b?nh ti?u ???ng c?a qu v? ? n?i lm vi?c ho?c tr??ng h?c c?a qu v?.  C?p nh?t l?ch tim ch?ng c?a qu v?. Qu v? nn tim v?c xin cm (b?nh cm) hng n?m. Qu v? c?ng nn tim v?c xin vim ph?i (ph? c?u khu?n). N?u qu v? 65 tu?i tr? ln v ch?a ???c tim v?c xin vim ph?i, v?c xin ny c th? ???c tim m?t lo?t hai m?i ring. Hy h?i chuyn gia ch?m [place] s?c kh?e xem nn tim thm lo?i v?c xin no.  H?c cch qu?n l c?ng th?ng.  Xin ???c gio d?c v h? tr? v? b?nh ti?u ???ng th??ng xuyn khi c?n.  Tham gia ho?c tm cch ph?c h?i ch?c n?ng khi c?n thi?t ?? duy tr ho?c c?i thi?n kh? n?ng ??c l?p v ch?t l??ng cu?c s?ng. Yu c?u chuy?n sang v?t l tr? li?u ho?c li?u php ngh? nghi?p n?u qu v? b? t bn chn ho?c t tay, ho?c kh ch?i ??u, kh m?c qu?n o, ?n u?ng ho?c ho?t ??ng th? ch?t. ?I K[facility] N?U:    Qu v? khng th? ?n ho?c u?ng trong h?n 6 ti?ng.  Qu v? b? bu?n nn v nn m?a trong h?n 6 ti?ng.  L??ng ???ng huy?t c?a qu v? cao trn 240 mg/dL.  C thay ??i tr?ng thi tinh th?n.  Qu v? b? thm m?t c?n b?nh nghim tr?ng.  Qu v? b? tiu ch?y trong h?n 6 ti?ng.  Qu v? ? b? ?m ho?c b? s?t trong m?t vi ngy v khng ?? h?n.  Qu v? b? ?au trong khi tham gia b?t k? ho?t ??ng thn th? no. NGAY L?P T?C ?I KHM N?U:  Qu v? b? kh th?.  Qu v? c l??ng ketone ? m?c trung bnh ??n cao.   Thng tin ny khng nh?m m?c ?ch thay th? cho l?i khuyn m chuyn gia ch?m [place] s?c kh?e ni v?i qu v?. Hy b?o ??m qu v? ph?i th?o lu?n b?t k? v?n ?? g m qu v? c v?i chuyn gia ch?m [place] s?c kh?e c?a qu v?.   Document Released: 09/02/2005 Document Revised: 05/24/2015 Elsevier Interactive Patient Education [employer]

## 2016-01-23 NOTE — Assessment & Plan Note (Signed)
Metformin refilled Stressed compliance with diabetic diet, regular exercise/being active and keeping weight down Referral to ophthalmology Check a1c, urine microalbumin and other basic blood work Follow up in 3 months

## 2016-01-25 LAB — URINE CULTURE
COLONY COUNT: NO GROWTH
Organism ID, Bacteria: NO GROWTH

## 2016-01-27 ENCOUNTER — Other Ambulatory Visit: Payer: Self-pay | Admitting: Internal Medicine

## 2016-01-27 DIAGNOSIS — E11319 Type 2 diabetes mellitus with unspecified diabetic retinopathy without macular edema: Secondary | ICD-10-CM

## 2016-01-27 MED ORDER — METFORMIN HCL 500 MG PO TABS: 1000.0000 mg | ORAL_TABLET | Freq: Two times a day (BID) | ORAL | Status: DC

## 2016-01-31 ENCOUNTER — Encounter: Payer: Self-pay | Admitting: Emergency Medicine

## 2016-04-28 NOTE — Progress Notes (Signed)
    Subjective:    Patient ID: [name], female    DOB: 06/21/1981, 35 y.o.   MRN: [id number]  HPI She is here for follow up.  Diabetes: She is taking her medication daily as prescribed. She is compliant with a diabetic diet. She is exercising regularly. She monitors her sugars and they have been running XXX. She checks her feet daily and denies foot lesions. She is up-to-date with an ophthalmology examination.      Medications and allergies reviewed with patient and updated if appropriate.  Patient Active Problem List   Diagnosis Date Noted  . Dysuria 01/23/2016  . Encounter for sterilization 07/19/2014  . Diabetes (HCC) 08/07/2013  . NONSPEC REACT TUBERCULIN SKIN TEST W/O ACTIVE TB 10/25/2010  . LIVER FUNCTION TESTS, ABNORMAL, HX OF 07/13/2010  . FATIGUE 07/12/2010    Current Outpatient Prescriptions on File Prior to Visit  Medication Sig Dispense Refill  . metFORMIN (GLUCOPHAGE) 500 MG tablet Take 2 tablets (1,000 mg total) by mouth 2 (two) times daily with a meal. 120 tablet 2   No current facility-administered medications on file prior to visit.     Past Medical History:  Diagnosis Date  . Diabetes mellitus without complication (HCC)    on Metformin    Past Surgical History:  Procedure Laterality Date  . LAPAROSCOPIC BILATERAL SALPINGECTOMY Bilateral 07/19/2014   Procedure: LAPAROSCOPIC BILATERAL SALPINGECTOMY;  Surgeon: Catalina AntiguaPeggy Constant, MD;  Location: [facility];  Service: Gynecology;  Laterality: Bilateral;    Social History   Social History  . Marital status: Single    Spouse name: N/A  . Number of children: N/A  . Years of education: N/A   Social History Main Topics  . Smoking status: Never Smoker  . Smokeless tobacco: Never Used  . Alcohol use No  . Drug use: No  . Sexual activity: Not Currently    Birth control/ protection: Surgical   Other Topics Concern  . Not on file   Social History Narrative  . No narrative on file    Family History    Problem Relation Age of Onset  . Diabetes Mother     Review of Systems     Objective:  There were no vitals filed for this visit. There were no vitals filed for this visit. There is no height or weight on file to calculate BMI.   Physical Exam         Assessment & Plan:   See Problem List for Assessment and Plan of chronic medical problems.   This encounter was created in error - please disregard.

## 2016-04-28 NOTE — Patient Instructions (Signed)
  Test(s) ordered today. Your results will be released to MyChart (or called to you) after review, usually within 72hours after test completion. If any changes need to be made, you will be notified at that same time.  All other Health Maintenance issues reviewed.   All recommended immunizations and age-appropriate screenings are up-to-date or discussed.  No immunizations administered today.   Medications reviewed and updated.  Changes include  /  No changes recommended at this time.  Your prescription(s) have been submitted to your pharmacy. Please take as directed and contact our office if you believe you are having problem(s) with the medication(s).  A referral  Please followup in    

## 2016-04-29 ENCOUNTER — Encounter: Payer: 59 | Admitting: Internal Medicine

## 2016-05-24 ENCOUNTER — Encounter (HOSPITAL_COMMUNITY): Payer: Self-pay | Admitting: *Deleted

## 2016-05-24 DIAGNOSIS — R1030 Lower abdominal pain, unspecified: Secondary | ICD-10-CM | POA: Diagnosis present

## 2016-05-24 DIAGNOSIS — N390 Urinary tract infection, site not specified: Secondary | ICD-10-CM | POA: Diagnosis not present

## 2016-05-24 DIAGNOSIS — Z7984 Long term (current) use of oral hypoglycemic drugs: Secondary | ICD-10-CM | POA: Insufficient documentation

## 2016-05-24 DIAGNOSIS — E119 Type 2 diabetes mellitus without complications: Secondary | ICD-10-CM | POA: Diagnosis not present

## 2016-05-24 NOTE — ED Triage Notes (Signed)
The pt is c/o pain in her abd  For 2 days she has not voided all day ?? She is sitting very comfortable at presentlmp aug 20 th

## 2016-05-25 ENCOUNTER — Emergency Department (HOSPITAL_COMMUNITY)
Admission: EM | Admit: 2016-05-25 | Discharge: 2016-05-25 | Disposition: A | Discharge status: Home / Self Care | Payer: 59 | Attending: Emergency Medicine | Admitting: Emergency Medicine

## 2016-05-25 DIAGNOSIS — N390 Urinary tract infection, site not specified: Secondary | ICD-10-CM

## 2016-05-25 LAB — COMPREHENSIVE METABOLIC PANEL
ALK PHOS: 50 U/L (ref 38–126)
ALT: 41 U/L (ref 14–54)
AST: 19 U/L (ref 15–41)
Albumin: 3.2 g/dL — ABNORMAL LOW (ref 3.5–5.0)
Anion gap: 7 (ref 5–15)
BUN: 13 mg/dL (ref 6–20)
CALCIUM: 9.4 mg/dL (ref 8.9–10.3)
CHLORIDE: 103 mmol/L (ref 101–111)
CO2: 27 mmol/L (ref 22–32)
CREATININE: 0.71 mg/dL (ref 0.44–1.00)
Glucose, Bld: 235 mg/dL — ABNORMAL HIGH (ref 65–99)
Potassium: 4.1 mmol/L (ref 3.5–5.1)
Sodium: 137 mmol/L (ref 135–145)
Total Bilirubin: 0.2 mg/dL — ABNORMAL LOW (ref 0.3–1.2)
Total Protein: 6.7 g/dL (ref 6.5–8.1)

## 2016-05-25 LAB — URINALYSIS, ROUTINE W REFLEX MICROSCOPIC
Bilirubin Urine: NEGATIVE
GLUCOSE, UA: 250 mg/dL — AB
Ketones, ur: NEGATIVE mg/dL
Nitrite: POSITIVE — AB
PH: 6 (ref 5.0–8.0)
Protein, ur: NEGATIVE mg/dL
SPECIFIC GRAVITY, URINE: 1.022 (ref 1.005–1.030)

## 2016-05-25 LAB — URINE MICROSCOPIC-ADD ON

## 2016-05-25 LAB — CBC
HCT: 36.1 % (ref 36.0–46.0)
Hemoglobin: 12.1 g/dL (ref 12.0–15.0)
MCH: 27.4 pg (ref 26.0–34.0)
MCHC: 33.5 g/dL (ref 30.0–36.0)
MCV: 81.7 fL (ref 78.0–100.0)
Platelets: 302 10*3/uL (ref 150–400)
RBC: 4.42 MIL/uL (ref 3.87–5.11)
RDW: 12.5 % (ref 11.5–15.5)
WBC: 11.4 10*3/uL — AB (ref 4.0–10.5)

## 2016-05-25 LAB — POC URINE PREG, ED: PREG TEST UR: NEGATIVE

## 2016-05-25 LAB — LIPASE, BLOOD: Lipase: 32 U/L (ref 11–51)

## 2016-05-25 MED ORDER — CEPHALEXIN 500 MG PO CAPS
500.0000 mg | ORAL_CAPSULE | Freq: Four times a day (QID) | ORAL | 0 refills | Status: DC
Start: 2016-05-25 — End: 2016-09-05

## 2016-05-25 NOTE — ED Notes (Signed)
EDP at bedside  

## 2016-05-25 NOTE — ED Provider Notes (Signed)
[facility]-EMERGENCY DEPT Provider Note   CSN: [id number] Arrival date & time: 05/24/16  2304  By signing my name below, I, Freida Busman, attest that this documentation has been prepared under the direction and in the presence of Melene Plan, DO . Electronically Signed: Freida Busman, Scribe. 05/25/2016. 2:04 AM.  History   Chief Complaint Chief Complaint  Patient presents with  . Abdominal Pain   The history is provided by the patient. No language interpreter was used.     HPI Comments:  [name] is a 35 y.o. female with a history of UTI, who presents to the Emergency Department complaining of moderate, suprapubic abdominal pain x 2 days. She notes associated dysuria and back pain. No alleviating factors noted.    Past Medical History:  Diagnosis Date  . Diabetes mellitus without complication (HCC)    on Metformin    Patient Active Problem List   Diagnosis Date Noted  . Dysuria 01/23/2016  . Encounter for sterilization 07/19/2014  . Diabetes (HCC) 08/07/2013  . NONSPEC REACT TUBERCULIN SKIN TEST W/O ACTIVE TB 10/25/2010  . LIVER FUNCTION TESTS, ABNORMAL, HX OF 07/13/2010  . FATIGUE 07/12/2010    Past Surgical History:  Procedure Laterality Date  . LAPAROSCOPIC BILATERAL SALPINGECTOMY Bilateral 07/19/2014   Procedure: LAPAROSCOPIC BILATERAL SALPINGECTOMY;  Surgeon: Catalina Antigua, MD;  Location: [facility];  Service: Gynecology;  Laterality: Bilateral;    OB History    Gravida Para Term Preterm AB Living   2 2 2     2    SAB TAB Ectopic Multiple Live Births                   Home Medications    Prior to Admission medications   Medication Sig Start Date End Date Taking? Authorizing Provider  metFORMIN (GLUCOPHAGE) 500 MG tablet Take 2 tablets (1,000 mg total) by mouth 2 (two) times daily with a meal. 01/27/16   Pincus Sanes, MD    Family History Family History  Problem Relation Age of Onset  . Diabetes Mother     Social History Social History  Substance Use Topics   . Smoking status: Never Smoker  . Smokeless tobacco: Never Used  . Alcohol use No     Allergies   Review of patient's allergies indicates no known allergies.   Review of Systems Review of Systems  Constitutional: Negative for chills and fever.  HENT: Negative for congestion and rhinorrhea.   Eyes: Negative for redness and visual disturbance.  Respiratory: Negative for shortness of breath and wheezing.   Cardiovascular: Negative for chest pain and palpitations.  Gastrointestinal: Positive for abdominal pain. Negative for nausea and vomiting.  Genitourinary: Positive for dysuria. Negative for urgency.  Musculoskeletal: Positive for back pain. Negative for arthralgias and myalgias.  Skin: Negative for pallor and wound.  Neurological: Negative for dizziness and headaches.  All other systems reviewed and are negative.   Physical Exam Updated Vital Signs BP 121/86   Pulse 90   Temp 98.2 F (36.8 C) (Oral)   Resp 16   Ht 5\' 4"  (1.626 m)   Wt 120 lb (54.4 kg)   LMP 05/05/2016   SpO2 100%   BMI 20.60 kg/m   Physical Exam  Constitutional: She is oriented to person, place, and time. She appears well-developed and well-nourished. No distress.  HENT:  Head: Normocephalic and atraumatic.  Eyes: EOM are normal. Pupils are equal, round, and reactive to light.  Neck: Normal range of motion. Neck supple.  Cardiovascular:  Normal rate and regular rhythm.  Exam reveals no gallop and no friction rub.   No murmur heard. Pulmonary/Chest: Effort normal. She has no wheezes. She has no rales.  Abdominal: Soft. She exhibits no distension. There is tenderness in the suprapubic area and left lower quadrant.  Musculoskeletal: She exhibits no edema or tenderness.  Neurological: She is alert and oriented to person, place, and time.  Skin: Skin is warm and dry. She is not diaphoretic.  Psychiatric: She has a normal mood and affect. Her behavior is normal.  Nursing note and vitals  reviewed.    ED Treatments / Results  DIAGNOSTIC STUDIES:  Oxygen Saturation is 100% on RA, normal by my interpretation.    COORDINATION OF CARE:  2:06 AM Discussed treatment plan with pt at bedside and pt agreed to plan.  Labs (all labs ordered are listed, but only abnormal results are displayed) Labs Reviewed  COMPREHENSIVE METABOLIC PANEL - Abnormal; Notable for the following:       Result Value   Glucose, Bld 235 (*)    Albumin 3.2 (*)    Total Bilirubin 0.2 (*)    All other components within normal limits  CBC - Abnormal; Notable for the following:    WBC 11.4 (*)    All other components within normal limits  URINALYSIS, ROUTINE W REFLEX MICROSCOPIC (NOT AT [facility]) - Abnormal; Notable for the following:    APPearance CLOUDY (*)    Glucose, UA 250 (*)    Hgb urine dipstick SMALL (*)    Nitrite POSITIVE (*)    Leukocytes, UA MODERATE (*)    All other components within normal limits  URINE MICROSCOPIC-ADD ON - Abnormal; Notable for the following:    Squamous Epithelial / LPF 0-5 (*)    Bacteria, UA MANY (*)    Casts HYALINE CASTS (*)    All other components within normal limits  LIPASE, BLOOD  POC URINE PREG, ED    EKG  EKG Interpretation None       Radiology No results found.  Procedures Procedures (including critical care time)  Medications Ordered in ED Medications - No data to display   Initial Impression / Assessment and Plan / ED Course  I have reviewed the triage vital signs and the nursing notes.  Pertinent labs & imaging results that were available during my care of the patient were reviewed by me and considered in my medical decision making (see chart for details).  Clinical Course    35 yo F With a chief complaint of dysuria abdominal pain and subjective fevers and chills. Patient with very mild abdominal pain on exam. Patient has a urinary tract infection.Patient has had multiple presentations with the same. We'll have the patient  follow-up with urology since she has had about a dozen ED diagnosis urinary tract infections.  2:17 AM:  I have discussed the diagnosis/risks/treatment options with the patient and family and believe the pt to be eligible for discharge home to follow-up with PCP. We also discussed returning to the ED immediately if new or worsening sx occur. We discussed the sx which are most concerning (e.g., sudden worsening pain, fever, inability to tolerate by mouth) that necessitate immediate return. Medications administered to the patient during their visit and any new prescriptions provided to the patient are listed below.  Medications given during this visit Medications - No data to display   The patient appears reasonably screen and/or stabilized for discharge and I doubt any other medical condition or  other EMC requiring further screening, evaluation, or treatment in the ED at this time prior to discharge.    Final Clinical Impressions(s) / ED Diagnoses   Final diagnoses:  None    New Prescriptions New Prescriptions   No medications on file    I personally performed the services described in this documentation, which was scribed in my presence. The recorded information has been reviewed and is accurate.     Melene Plan, DO 05/25/16 [phone]

## 2016-05-25 NOTE — ED Notes (Signed)
Please note pt ambulated to restroom and voided

## 2016-08-23 ENCOUNTER — Ambulatory Visit: Payer: 59 | Admitting: Internal Medicine

## 2016-09-05 NOTE — Progress Notes (Signed)
Subjective:    Patient ID: [name], female    DOB: 01-24-1981, 35 y.o.   MRN: [id number]  HPI The patient is here for follow up.  Her son is here today and interprets.   Diabetes: She is taking her medication daily, but is only taking it 1000 mg with breakfast.  The evening dose she stopped due to it causing dizziness, nausea and weakness. She is compliant with a diabetic diet. She only drinks water and has cut down on her rice intake.  She does not eat a lot of sweets.  She has been eating more fruit.  She is active at work, but not exercising regularly.  She does not check her sugars.     When she urinates it itches on the outside and it does not happen daily.  The itching is not there constantly.  She denies pain with urination or abdominal pain.  She sometimes has a cold sensation.    Medications and allergies reviewed with patient and updated if appropriate.  Patient Active Problem List   Diagnosis Date Noted  . Diabetes (HCC) 08/07/2013  . NONSPEC REACT TUBERCULIN SKIN TEST W/O ACTIVE TB 10/25/2010  . LIVER FUNCTION TESTS, ABNORMAL, HX OF 07/13/2010    No current outpatient prescriptions on file prior to visit.   No current facility-administered medications on file prior to visit.     Past Medical History:  Diagnosis Date  . Diabetes mellitus without complication (HCC)    on Metformin    Past Surgical History:  Procedure Laterality Date  . LAPAROSCOPIC BILATERAL SALPINGECTOMY Bilateral 07/19/2014   Procedure: LAPAROSCOPIC BILATERAL SALPINGECTOMY;  Surgeon: Catalina AntiguaPeggy Constant, MD;  Location: [facility];  Service: Gynecology;  Laterality: Bilateral;    Social History   Social History  . Marital status: Single    Spouse name: N/A  . Number of children: N/A  . Years of education: N/A   Social History Main Topics  . Smoking status: Never Smoker  . Smokeless tobacco: Never Used  . Alcohol use No  . Drug use: No  . Sexual activity: Not Currently    Birth control/  protection: Surgical   Other Topics Concern  . None   Social History Narrative  . None    Family History  Problem Relation Age of Onset  . Diabetes Mother     Review of Systems  Constitutional: Negative for fever.  Respiratory: Negative for cough, shortness of breath and wheezing.   Cardiovascular: Negative for chest pain, palpitations and leg swelling.  Gastrointestinal: Negative for abdominal pain.  Genitourinary: Negative for dysuria.  Neurological: Negative for light-headedness and headaches.       Objective:   Vitals:   09/06/16 0929  BP: 122/86  Pulse: 95  Resp: 16  Temp: 98 F (36.7 C)   Filed Weights   09/06/16 0929  Weight: 129 lb (58.5 kg)   Body mass index is 22.14 kg/m.   Physical Exam    Constitutional: Appears well-developed and well-nourished. No distress.  HENT:  Head: Normocephalic and atraumatic.  Neck: Neck supple. No tracheal deviation present. No thyromegaly present.  No cervical lymphadenopathy Cardiovascular: Normal rate, regular rhythm and normal heart sounds.   No murmur heard. No carotid bruit .  No edema Pulmonary/Chest: Effort normal and breath sounds normal. No respiratory distress. No has no wheezes. No rales.  Abdomen: non tender, soft, non distended Skin: Skin is warm and dry. Not diaphoretic.  Psychiatric: Normal mood and affect. Behavior is normal.  Assessment & Plan:   Establish with a gyn  See Problem List for Assessment and Plan of chronic medical problems.

## 2016-09-06 ENCOUNTER — Ambulatory Visit (INDEPENDENT_AMBULATORY_CARE_PROVIDER_SITE_OTHER): Payer: 59 | Admitting: Internal Medicine

## 2016-09-06 ENCOUNTER — Encounter: Payer: Self-pay | Admitting: Internal Medicine

## 2016-09-06 VITALS — BP 122/86 | HR 95 | Temp 98.0°F | Resp 16 | Ht 64.0 in | Wt 129.0 lb

## 2016-09-06 DIAGNOSIS — N898 Other specified noninflammatory disorders of vagina: Secondary | ICD-10-CM

## 2016-09-06 DIAGNOSIS — L298 Other pruritus: Secondary | ICD-10-CM | POA: Diagnosis not present

## 2016-09-06 DIAGNOSIS — Z23 Encounter for immunization: Secondary | ICD-10-CM

## 2016-09-06 DIAGNOSIS — E11319 Type 2 diabetes mellitus with unspecified diabetic retinopathy without macular edema: Secondary | ICD-10-CM | POA: Diagnosis not present

## 2016-09-06 LAB — POCT GLYCOSYLATED HEMOGLOBIN (HGB A1C): Hemoglobin A1C: 10.8

## 2016-09-06 MED ORDER — METFORMIN HCL 500 MG PO TABS: 1000.0000 mg | ORAL_TABLET | Freq: Every day | ORAL | 5 refills | Status: DC

## 2016-09-06 MED ORDER — SAXAGLIPTIN HCL 5 MG PO TABS: 5.0000 mg | ORAL_TABLET | Freq: Every day | ORAL | 5 refills | Status: DC

## 2016-09-06 NOTE — Assessment & Plan Note (Signed)
Intermittent symptoms not consistent with UTI Since symptoms are intermittent - will have her establish with gyn, which she needs to do - number given

## 2016-09-06 NOTE — Patient Instructions (Addendum)
  [facility] [address]  [place], [place]  [phone]    Your a1c is 10.8 and it should be less than 7.   All other Health Maintenance issues reviewed.   All recommended immunizations and age-appropriate screenings are up-to-date or discussed.  Flu vaccine administered today.   Medications reviewed and updated.  Changes include continuing metformin 1000 mg (two pills) with breakfast.  You will also start a new medication, called Onglyza -  Take medication once daily.  If it is too expensive we will try a different medication.  If you have side effects please call  Your prescription(s) have been submitted to your pharmacy. Please take as directed and contact our office if you believe you are having problem(s) with the medication(s).   Please followup in 3 months

## 2016-09-06 NOTE — Progress Notes (Signed)
Pre visit review using our clinic review tool, if applicable. No additional management support is needed unless otherwise documented below in the visit note. 

## 2016-09-06 NOTE — Assessment & Plan Note (Addendum)
a1c 10.8 here - not controlled - stressed importance of getting sugars better controlled Continue metformin 1000 mg daily with breakfast Start Onglyza 5 mg daily - if too expensive will start glipizide Increase exercise Decrease fruit intake Re-evaluate carb/sugar intake Work on weight loss Discussed that we can refer to nutrition Follow up in 3 months

## 2016-09-27 ENCOUNTER — Ambulatory Visit (HOSPITAL_COMMUNITY)
Admission: EM | Admit: 2016-09-27 | Discharge: 2016-09-27 | Disposition: A | Discharge status: Home / Self Care | Payer: 59 | Attending: Family Medicine | Admitting: Family Medicine

## 2016-09-27 ENCOUNTER — Emergency Department (HOSPITAL_COMMUNITY)
Admission: EM | Admit: 2016-09-27 | Discharge: 2016-09-27 | Disposition: A | Discharge status: Home / Self Care | Payer: 59 | Attending: Emergency Medicine | Admitting: Emergency Medicine

## 2016-09-27 ENCOUNTER — Encounter (HOSPITAL_COMMUNITY): Payer: Self-pay | Admitting: Emergency Medicine

## 2016-09-27 DIAGNOSIS — Z5321 Procedure and treatment not carried out due to patient leaving prior to being seen by health care provider: Secondary | ICD-10-CM | POA: Diagnosis not present

## 2016-09-27 DIAGNOSIS — Z7984 Long term (current) use of oral hypoglycemic drugs: Secondary | ICD-10-CM | POA: Insufficient documentation

## 2016-09-27 DIAGNOSIS — R81 Glycosuria: Secondary | ICD-10-CM | POA: Diagnosis not present

## 2016-09-27 DIAGNOSIS — R300 Dysuria: Secondary | ICD-10-CM | POA: Diagnosis not present

## 2016-09-27 DIAGNOSIS — N3000 Acute cystitis without hematuria: Secondary | ICD-10-CM

## 2016-09-27 DIAGNOSIS — E119 Type 2 diabetes mellitus without complications: Secondary | ICD-10-CM | POA: Insufficient documentation

## 2016-09-27 DIAGNOSIS — K5901 Slow transit constipation: Secondary | ICD-10-CM

## 2016-09-27 LAB — URINALYSIS, ROUTINE W REFLEX MICROSCOPIC
Bilirubin Urine: NEGATIVE
Glucose, UA: >=500 mg/dL — AB
Hgb urine dipstick: NEGATIVE
Ketones, ur: NEGATIVE mg/dL
Nitrite: NEGATIVE
PROTEIN: NEGATIVE mg/dL
SPECIFIC GRAVITY, URINE: 1.011 (ref 1.005–1.030)
pH: 6 (ref 5.0–8.0)

## 2016-09-27 LAB — POCT PREGNANCY, URINE: Preg Test, Ur: NEGATIVE

## 2016-09-27 LAB — POCT URINALYSIS DIP (DEVICE)
BILIRUBIN URINE: NEGATIVE
GLUCOSE, UA: 500 mg/dL — AB
NITRITE: NEGATIVE
Protein, ur: NEGATIVE mg/dL
Specific Gravity, Urine: 1.025 (ref 1.005–1.030)
Urobilinogen, UA: 0.2 mg/dL (ref 0.0–1.0)
pH: 5.5 (ref 5.0–8.0)

## 2016-09-27 LAB — PREGNANCY, URINE: PREG TEST UR: NEGATIVE

## 2016-09-27 MED ORDER — CEPHALEXIN 500 MG PO CAPS: 500.0000 mg | ORAL_CAPSULE | Freq: Four times a day (QID) | ORAL | 0 refills | Status: DC

## 2016-09-27 NOTE — ED Triage Notes (Signed)
Pt complaining of irritation with urination. Pt states left sided flank pain. Pt also complaining of fever and chills. Pt denies any abdominal pain, vaginal bleeding or discharge.

## 2016-09-27 NOTE — Discharge Instructions (Signed)
Use the MiraLAX as instructed. 3 glasses one every 30 minutes with MiraLAX. If his 6 hour she do not have good results repeat area also take more fluids in general. He will have sugar in your urine. This may indicate elevated blood sugar. Check her blood sugars frequently. If they are elevated on average you need to follow-up with your primary care doctor. You are being prescribed an antibiotic for a urinary tract infection. Take the antibiotic as directed. If you are not feeling better in 2 or 3 days follow-up with your primary care doctor. Also recommend taking Azo-Standard to help with urinary symptoms. He can get this without a prescription.

## 2016-09-27 NOTE — ED Triage Notes (Signed)
[facility] with a complaint of lower abdominal pain on the right side, constipation and dysuria with urinary frequency x 3 days.

## 2016-09-27 NOTE — ED Notes (Signed)
Lab called, states patient's sample spilled in to the bag.  Pt provided with new cup and asked for sample when able

## 2016-09-27 NOTE — ED Provider Notes (Signed)
CSN: [id number]     Arrival date & time 09/27/16  1919 History   None    Chief Complaint  Patient presents with  . Constipation  . Dysuria   (Consider location/radiation/quality/duration/timing/severity/associated sxs/prior Treatment) 36 year old female states that abruptly 2 days ago she developed dysuria, frequency and low volume urine per void. She is also complaining of constipation, having to strain and only a few small "balls" of stool with defecation. She is complaining of left lower quadrant discomfort not right. Denies fever, chills, diarrhea or vomiting.      Past Medical History:  Diagnosis Date  . Diabetes mellitus without complication (HCC)    on Metformin   Past Surgical History:  Procedure Laterality Date  . LAPAROSCOPIC BILATERAL SALPINGECTOMY Bilateral 07/19/2014   Procedure: LAPAROSCOPIC BILATERAL SALPINGECTOMY;  Surgeon: Catalina AntiguaPeggy Constant, MD;  Location: [facility];  Service: Gynecology;  Laterality: Bilateral;   Family History  Problem Relation Age of Onset  . Diabetes Mother    Social History  Substance Use Topics  . Smoking status: Never Smoker  . Smokeless tobacco: Never Used  . Alcohol use No   OB History    Gravida Para Term Preterm AB Living   2 2 2     2    SAB TAB Ectopic Multiple Live Births                 Review of Systems  Constitutional: Negative.  Negative for fever.  HENT: Negative.   Respiratory: Negative.   Cardiovascular: Negative for chest pain.  Gastrointestinal: Positive for constipation. Negative for abdominal distention, blood in stool, diarrhea, nausea and vomiting.       Abdominal discomfort greatest in the left lower quadrant.  Genitourinary: Positive for dysuria and frequency. Negative for hematuria and pelvic pain.  Musculoskeletal: Negative.   Neurological: Negative.   All other systems reviewed and are negative.   Allergies  Patient has no known allergies.  Home Medications   Prior to Admission medications    Medication Sig Start Date End Date Taking? Authorizing Provider  metFORMIN (GLUCOPHAGE) 500 MG tablet Take 2 tablets (1,000 mg total) by mouth daily with breakfast. 09/06/16  Yes Pincus SanesStacy J Burns, MD  saxagliptin HCl (ONGLYZA) 5 MG TABS tablet Take 1 tablet (5 mg total) by mouth daily. 09/06/16  Yes Pincus SanesStacy J Burns, MD  cephALEXin (KEFLEX) 500 MG capsule Take 1 capsule (500 mg total) by mouth 4 (four) times daily. 09/27/16   Hayden Rasmussenavid Candon Caras, NP   Meds Ordered and Administered this Visit  Medications - No data to display  BP 117/67 (BP Location: Right Arm)   Pulse 99   Temp 98 F (36.7 C) (Oral)   Resp 16   SpO2 99%  No data found.   Physical Exam  Constitutional: She is oriented to person, place, and time. She appears well-developed and well-nourished. No distress.  Neck: Neck supple.  Cardiovascular: Normal rate, regular rhythm, normal heart sounds and intact distal pulses.   Pulmonary/Chest: Effort normal and breath sounds normal.  Abdominal: Soft. Bowel sounds are normal. She exhibits no distension.  Minor tenderness in the left lower quadrant. Percussion reveals dullness and most of the abdomen and greatest in the left lower quadrant. No distention. No rebound or guarding.  Musculoskeletal: She exhibits no edema.  Neurological: She is alert and oriented to person, place, and time.  Skin: Skin is warm and dry.  Psychiatric: She has a normal mood and affect.  Nursing note and vitals reviewed.   Urgent Care  Course   Clinical Course     Procedures (including critical care time)  Labs Review Labs Reviewed  POCT URINALYSIS DIP (DEVICE) - Abnormal; Notable for the following:       Result Value   Glucose, UA 500 (*)    Ketones, ur TRACE (*)    Hgb urine dipstick TRACE (*)    Leukocytes, UA SMALL (*)    All other components within normal limits  POCT PREGNANCY, URINE    Imaging Review No results found.   Visual Acuity Review  Right Eye Distance:   Left Eye Distance:    Bilateral Distance:    Right Eye Near:   Left Eye Near:    Bilateral Near:         MDM   1. Slow transit constipation   2. Glycosuria   3. Acute cystitis without hematuria    Use the MiraLAX as instructed. 3 glasses one every 30 minutes with MiraLAX. If his 6 hour she do not have good results repeat area also take more fluids in general. He will have sugar in your urine. This may indicate elevated blood sugar. Check her blood sugars frequently. If they are elevated on average you need to follow-up with your primary care doctor. You are being prescribed an antibiotic for a urinary tract infection. Take the antibiotic as directed. If you are not feeling better in 2 or 3 days follow-up with your primary care doctor. Also recommend taking Azo-Standard to help with urinary symptoms. He can get this without a prescription. Meds ordered this encounter  Medications  . cephALEXin (KEFLEX) 500 MG capsule    Sig: Take 1 capsule (500 mg total) by mouth 4 (four) times daily.    Dispense:  28 capsule    Refill:  0    Order Specific Question:   Supervising Provider    Answer:   Linna Hoff [[id number]]       Hayden Rasmussen, NP 09/27/16 2038

## 2016-09-27 NOTE — ED Notes (Signed)
Came up to NF states he isn't waiting any longer.

## 2016-12-05 ENCOUNTER — Other Ambulatory Visit (INDEPENDENT_AMBULATORY_CARE_PROVIDER_SITE_OTHER): Payer: 59

## 2016-12-05 ENCOUNTER — Encounter: Payer: Self-pay | Admitting: Internal Medicine

## 2016-12-05 ENCOUNTER — Ambulatory Visit (INDEPENDENT_AMBULATORY_CARE_PROVIDER_SITE_OTHER): Payer: 59 | Admitting: Internal Medicine

## 2016-12-05 VITALS — BP 120/82 | HR 95 | Temp 97.9°F | Ht 64.0 in | Wt 128.0 lb

## 2016-12-05 DIAGNOSIS — E11319 Type 2 diabetes mellitus with unspecified diabetic retinopathy without macular edema: Secondary | ICD-10-CM

## 2016-12-05 DIAGNOSIS — N390 Urinary tract infection, site not specified: Secondary | ICD-10-CM | POA: Insufficient documentation

## 2016-12-05 DIAGNOSIS — R300 Dysuria: Secondary | ICD-10-CM

## 2016-12-05 LAB — CBC WITH DIFFERENTIAL/PLATELET
BASOS PCT: 0.8 % (ref 0.0–3.0)
Basophils Absolute: 0.1 10*3/uL (ref 0.0–0.1)
EOS PCT: 1.1 % (ref 0.0–5.0)
Eosinophils Absolute: 0.1 10*3/uL (ref 0.0–0.7)
HCT: 40.5 % (ref 36.0–46.0)
HEMOGLOBIN: 13.6 g/dL (ref 12.0–15.0)
Lymphocytes Relative: 38.1 % (ref 12.0–46.0)
Lymphs Abs: 3.9 10*3/uL (ref 0.7–4.0)
MCHC: 33.7 g/dL (ref 30.0–36.0)
MCV: 81.5 fl (ref 78.0–100.0)
MONO ABS: 0.7 10*3/uL (ref 0.1–1.0)
Monocytes Relative: 6.3 % (ref 3.0–12.0)
Neutro Abs: 5.5 10*3/uL (ref 1.4–7.7)
Neutrophils Relative %: 53.7 % (ref 43.0–77.0)
Platelets: 358 10*3/uL (ref 150.0–400.0)
RBC: 4.97 Mil/uL (ref 3.87–5.11)
RDW: 13.1 % (ref 11.5–15.5)
WBC: 10.3 10*3/uL (ref 4.0–10.5)

## 2016-12-05 LAB — URINALYSIS, ROUTINE W REFLEX MICROSCOPIC
Bilirubin Urine: NEGATIVE
Ketones, ur: NEGATIVE
Nitrite: NEGATIVE
Specific Gravity, Urine: 1.02 (ref 1.000–1.030)
Total Protein, Urine: 100 — AB
Urine Glucose: NEGATIVE
Urobilinogen, UA: 0.2 (ref 0.0–1.0)
pH: 5.5 (ref 5.0–8.0)

## 2016-12-05 LAB — COMPREHENSIVE METABOLIC PANEL
ALT: 20 U/L (ref 0–35)
AST: 13 U/L (ref 0–37)
Albumin: 4.1 g/dL (ref 3.5–5.2)
Alkaline Phosphatase: 54 U/L (ref 39–117)
BUN: 14 mg/dL (ref 6–23)
CHLORIDE: 101 meq/L (ref 96–112)
CO2: 27 mEq/L (ref 19–32)
Calcium: 10.1 mg/dL (ref 8.4–10.5)
Creatinine, Ser: 0.56 mg/dL (ref 0.40–1.20)
GFR: 129.87 mL/min (ref >60.00)
GLUCOSE: 204 mg/dL — AB (ref 70–99)
POTASSIUM: 4 meq/L (ref 3.5–5.1)
SODIUM: 135 meq/L (ref 135–145)
TOTAL PROTEIN: 7.7 g/dL (ref 6.0–8.3)
Total Bilirubin: 0.5 mg/dL (ref 0.2–1.2)

## 2016-12-05 LAB — MICROALBUMIN / CREATININE URINE RATIO
CREATININE, U: 64.3 mg/dL
MICROALB UR: 34.2 mg/dL — AB (ref 0.0–1.9)
MICROALB/CREAT RATIO: 53.2 mg/g — AB (ref 0.0–30.0)

## 2016-12-05 LAB — HEMOGLOBIN A1C: HEMOGLOBIN A1C: 8.7 % — AB (ref 4.6–6.5)

## 2016-12-05 MED ORDER — SAXAGLIPTIN HCL 5 MG PO TABS: 5.0000 mg | ORAL_TABLET | Freq: Every day | ORAL | 1 refills | Status: DC

## 2016-12-05 MED ORDER — METFORMIN HCL 500 MG PO TABS: 500.0000 mg | ORAL_TABLET | Freq: Two times a day (BID) | ORAL | 3 refills | Status: DC

## 2016-12-05 NOTE — Assessment & Plan Note (Signed)
Will refer to urology - I am not convinced she is having this many UTI's - may have a different cause for her dyusuria

## 2016-12-05 NOTE — Assessment & Plan Note (Signed)
Continue diabetic diet and regular exercise Continue current medication Will likely need a third medication Check a1c f/u in 3 months

## 2016-12-05 NOTE — Patient Instructions (Signed)
  Test(s) ordered today. Your results will be released to MyChart (or called to you) after review, usually within 72hours after test completion. If any changes need to be made, you will be notified at that same time.   Medications reviewed and updated.  No changes recommended at this time.  Your prescription(s) have been submitted to your pharmacy. Please take as directed and contact our office if you believe you are having problem(s) with the medication(s).  A referral was ordered for urology.  Please followup in 3 months  

## 2016-12-05 NOTE — Assessment & Plan Note (Signed)
Check UA, UCx - will treat only if culture is positive

## 2016-12-05 NOTE — Progress Notes (Signed)
Pre visit review using our clinic review tool, if applicable. No additional management support is needed unless otherwise documented below in the visit note. 

## 2016-12-05 NOTE — Progress Notes (Signed)
Subjective:    Patient ID: [name], female    DOB: 02/29/1981, 36 y.o.   MRN: [id number]  HPI The patient is here for follow up.  He son is interpreting.    Diabetes: She is taking her medication daily as prescribed. She can only tolerate one metformin twice a day. She is compliant with a diabetic diet. She is exercising regularly - walking. She checks her feet daily and denies foot lesions. She is not up-to-date with an ophthalmology examination.   ? UTI:  She has dysuria for the past 2-3 weeks.  She does not have urinary frequency, hematuria, abdominal pain or nausea.  No fevers.  She has a history of frequent UTI's and has been on multiple antibiotics.    Medications and allergies reviewed with patient and updated if appropriate.  Patient Active Problem List   Diagnosis Date Noted  . Vaginal itching 09/06/2016  . Diabetes (HCC) 08/07/2013  . NONSPEC REACT TUBERCULIN SKIN TEST W/O ACTIVE TB 10/25/2010    Current Outpatient Prescriptions on File Prior to Visit  Medication Sig Dispense Refill  . metFORMIN (GLUCOPHAGE) 500 MG tablet Take 2 tablets (1,000 mg total) by mouth daily with breakfast. 60 tablet 5  . saxagliptin HCl (ONGLYZA) 5 MG TABS tablet Take 1 tablet (5 mg total) by mouth daily. 30 tablet 5   No current facility-administered medications on file prior to visit.     Past Medical History:  Diagnosis Date  . Diabetes mellitus without complication (HCC)    on Metformin    Past Surgical History:  Procedure Laterality Date  . LAPAROSCOPIC BILATERAL SALPINGECTOMY Bilateral 07/19/2014   Procedure: LAPAROSCOPIC BILATERAL SALPINGECTOMY;  Surgeon: Catalina AntiguaPeggy Constant, MD;  Location: [facility];  Service: Gynecology;  Laterality: Bilateral;    Social History   Social History  . Marital status: Single    Spouse name: N/A  . Number of children: N/A  . Years of education: N/A   Social History Main Topics  . Smoking status: Never Smoker  . Smokeless tobacco: Never Used    . Alcohol use No  . Drug use: No  . Sexual activity: Not Currently    Birth control/ protection: Surgical   Other Topics Concern  . Not on file   Social History Narrative  . No narrative on file    Family History  Problem Relation Age of Onset  . Diabetes Mother     Review of Systems  Constitutional: Negative for chills and fever.  Respiratory: Negative for cough, shortness of breath and wheezing.   Cardiovascular: Negative for chest pain, palpitations and leg swelling.  Gastrointestinal: Negative for abdominal pain and nausea.  Genitourinary: Positive for dysuria. Negative for frequency and hematuria.  Neurological: Negative for light-headedness and headaches.       Objective:   Vitals:   12/05/16 0953  BP: 120/82  Pulse: 95  Temp: 97.9 F (36.6 C)   Wt Readings from Last 3 Encounters:  12/05/16 128 lb (58.1 kg)  09/06/16 129 lb (58.5 kg)  05/24/16 120 lb (54.4 kg)   Body mass index is 21.97 kg/m.   Physical Exam    Constitutional: Appears well-developed and well-nourished. No distress.  HENT:  Head: Normocephalic and atraumatic.  Neck: Neck supple. No tracheal deviation present. No thyromegaly present.  No cervical lymphadenopathy Cardiovascular: Normal rate, regular rhythm and normal heart sounds.   No murmur heard. No carotid bruit .  No edema Pulmonary/Chest: Effort normal and breath sounds normal. No respiratory distress.  No has no wheezes. No rales.  Foot exam complete Skin: Skin is warm and dry. Not diaphoretic.  Psychiatric: Normal mood and affect. Behavior is normal.      Assessment & Plan:    See Problem List for Assessment and Plan of chronic medical problems.   FU in 3 months

## 2016-12-06 LAB — URINE CULTURE: Organism ID, Bacteria: NO GROWTH

## 2016-12-07 ENCOUNTER — Other Ambulatory Visit: Payer: Self-pay | Admitting: Internal Medicine

## 2016-12-07 MED ORDER — GLIPIZIDE 10 MG PO TABS: 10.0000 mg | ORAL_TABLET | Freq: Every day | ORAL | 5 refills | Status: DC

## 2016-12-09 ENCOUNTER — Telehealth: Payer: Self-pay | Admitting: Internal Medicine

## 2016-12-09 MED ORDER — GLIPIZIDE 10 MG PO TABS: 10.0000 mg | ORAL_TABLET | Freq: Two times a day (BID) | ORAL | 5 refills | Status: DC

## 2016-12-09 NOTE — Telephone Encounter (Signed)
I am assuming this is in regards to the onglyza - ?    D/c onglyza.  Continue metformin.  Increase glucotrol to 10 mg twice daily.  (new rx for glucotrol sent to pharmacy

## 2016-12-09 NOTE — Telephone Encounter (Signed)
Please advise 

## 2016-12-09 NOTE — Telephone Encounter (Signed)
Pharmacy called stating Pt would like to be given something cheaper. Can not afford it every month. Please advise.

## 2016-12-10 ENCOUNTER — Encounter: Payer: Self-pay | Admitting: Emergency Medicine

## 2016-12-10 NOTE — Telephone Encounter (Signed)
Tried contacting pt unable to LVM, will add medication change to lab results letter.

## 2017-03-06 NOTE — Progress Notes (Signed)
    Subjective:    Patient ID: [name], female    DOB: 1980/12/25, 36 y.o.   MRN: [id number]  HPI      Medications and allergies reviewed with patient and updated if appropriate.  Patient Active Problem List   Diagnosis Date Noted  . 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 Dysuria 12/05/2016  . Diabetes (HCC) 08/07/2013  . NONSPEC REACT TUBERCULIN SKIN TEST W/O ACTIVE TB 10/25/2010    Current Outpatient Prescriptions on File Prior to Visit  Medication Sig Dispense Refill  . glipiZIDE (GLUCOTROL) 10 MG tablet Take 1 tablet (10 mg total) by mouth 2 (two) times daily before a meal. 60 tablet 5  . metFORMIN (GLUCOPHAGE) 500 MG tablet Take 1 tablet (500 mg total) by mouth 2 (two) times daily with a meal. 180 tablet 3   No current facility-administered medications on file prior to visit.     Past Medical History:  Diagnosis Date  . Diabetes mellitus without complication (HCC)    on Metformin    Past Surgical History:  Procedure Laterality Date  . LAPAROSCOPIC BILATERAL SALPINGECTOMY Bilateral 07/19/2014   Procedure: LAPAROSCOPIC BILATERAL SALPINGECTOMY;  Surgeon: Catalina AntiguaPeggy Constant, MD;  Location: [facility];  Service: Gynecology;  Laterality: Bilateral;    Social History   Social History  . Marital status: Single    Spouse name: N/A  . Number of children: N/A  . Years of education: N/A   Social History Main Topics  . Smoking status: Never Smoker  . Smokeless tobacco: Never Used  . Alcohol use No  . Drug use: No  . Sexual activity: Not Currently    Birth control/ protection: Surgical   Other Topics Concern  . Not on file   Social History Narrative  . No narrative on file    Family History  Problem Relation Age of Onset  . Diabetes Mother     Review of Systems     Objective:  There were no vitals filed for this visit. Wt Readings from Last 3 Encounters:  12/05/16 128 lb (58.1 kg)  09/06/16 129 lb (58.5 kg)  05/24/16 120 lb (54.4 kg)   There is no height or weight  on file to calculate BMI.   Physical Exam         Assessment & Plan:    See Problem List for Assessment and Plan of chronic medical problems.    This encounter was created in error - please disregard.

## 2017-03-07 ENCOUNTER — Encounter: Payer: 59 | Admitting: Internal Medicine

## 2017-03-19 NOTE — Progress Notes (Signed)
Subjective:    Patient ID: [name], female    DOB: 21-Oct-1980, 36 y.o.   MRN: [id number]  HPI The patient is here for follow up.  Diabetes: She is taking her medication daily as prescribed. She is compliant with a diabetic diet. She is exercising regularly, takes long walks and is very active at work.   She is not up-to-date with an ophthalmology examination.     Medications and allergies reviewed with patient and updated if appropriate.  Patient Active Problem List   Diagnosis Date Noted  . 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 Dysuria 12/05/2016  . Diabetes (HCC) 08/07/2013  . NONSPEC REACT TUBERCULIN SKIN TEST W/O ACTIVE TB 10/25/2010    Current Outpatient Prescriptions on File Prior to Visit  Medication Sig Dispense Refill  . glipiZIDE (GLUCOTROL) 10 MG tablet Take 1 tablet (10 mg total) by mouth 2 (two) times daily before a meal. 60 tablet 5  . metFORMIN (GLUCOPHAGE) 500 MG tablet Take 1 tablet (500 mg total) by mouth 2 (two) times daily with a meal. 180 tablet 3   No current facility-administered medications on file prior to visit.     Past Medical History:  Diagnosis Date  . Diabetes mellitus without complication (HCC)    on Metformin    Past Surgical History:  Procedure Laterality Date  . LAPAROSCOPIC BILATERAL SALPINGECTOMY Bilateral 07/19/2014   Procedure: LAPAROSCOPIC BILATERAL SALPINGECTOMY;  Surgeon: Catalina AntiguaPeggy Constant, MD;  Location: [facility];  Service: Gynecology;  Laterality: Bilateral;    Social History   Social History  . Marital status: Single    Spouse name: N/A  . Number of children: N/A  . Years of education: N/A   Social History Main Topics  . Smoking status: Never Smoker  . Smokeless tobacco: Never Used  . Alcohol use No  . Drug use: No  . Sexual activity: Not Currently    Birth control/ protection: Surgical   Other Topics Concern  . None   Social History Narrative  . None    Family History  Problem Relation Age of Onset  . Diabetes  Mother     Review of Systems  Constitutional: Negative for chills and fever.  Respiratory: Negative for cough, shortness of breath and wheezing.   Cardiovascular: Positive for leg swelling (when standing long periods). Negative for chest pain and palpitations.  Neurological: Negative for light-headedness and headaches.       Objective:   Vitals:   03/20/17 0927  BP: (!) 144/78  Pulse: 97  Resp: 12  Temp: 98 F (36.7 C)   Wt Readings from Last 3 Encounters:  03/20/17 138 lb (62.6 kg)  12/05/16 128 lb (58.1 kg)  09/06/16 129 lb (58.5 kg)   Body mass index is 23.69 kg/m.  BP Readings from Last 3 Encounters:  03/20/17 (!) 144/78  12/05/16 120/82  09/27/16 117/67      Physical Exam    Constitutional: Appears well-developed and well-nourished. No distress.  HENT:  Head: Normocephalic and atraumatic.  Neck: Neck supple. No tracheal deviation present. No thyromegaly present.  No cervical lymphadenopathy Cardiovascular: Normal rate, regular rhythm and normal heart sounds.   No murmur heard. No carotid bruit .  No edema Pulmonary/Chest: Effort normal and breath sounds normal. No respiratory distress. No has no wheezes. No rales.  Skin: Skin is warm and dry. Not diaphoretic.  Psychiatric: Normal mood and affect. Behavior is normal.      Assessment & Plan:    See Problem List for  Assessment and Plan of chronic medical problems.

## 2017-03-20 ENCOUNTER — Other Ambulatory Visit (INDEPENDENT_AMBULATORY_CARE_PROVIDER_SITE_OTHER): Payer: 59

## 2017-03-20 ENCOUNTER — Encounter: Payer: Self-pay | Admitting: Internal Medicine

## 2017-03-20 ENCOUNTER — Ambulatory Visit (INDEPENDENT_AMBULATORY_CARE_PROVIDER_SITE_OTHER): Payer: 59 | Admitting: Internal Medicine

## 2017-03-20 VITALS — BP 144/78 | HR 97 | Temp 98.0°F | Resp 12 | Ht 64.0 in | Wt 138.0 lb

## 2017-03-20 DIAGNOSIS — Z23 Encounter for immunization: Secondary | ICD-10-CM | POA: Diagnosis not present

## 2017-03-20 DIAGNOSIS — E11319 Type 2 diabetes mellitus with unspecified diabetic retinopathy without macular edema: Secondary | ICD-10-CM

## 2017-03-20 DIAGNOSIS — R030 Elevated blood-pressure reading, without diagnosis of hypertension: Secondary | ICD-10-CM | POA: Diagnosis not present

## 2017-03-20 LAB — COMPREHENSIVE METABOLIC PANEL
ALBUMIN: 3.8 g/dL (ref 3.5–5.2)
ALT: 23 U/L (ref 0–35)
AST: 16 U/L (ref 0–37)
Alkaline Phosphatase: 45 U/L (ref 39–117)
BUN: 7 mg/dL (ref 6–23)
CALCIUM: 8.9 mg/dL (ref 8.4–10.5)
CO2: 26 meq/L (ref 19–32)
CREATININE: 0.5 mg/dL (ref 0.40–1.20)
Chloride: 106 mEq/L (ref 96–112)
GFR: 147.78 mL/min (ref >60.00)
Glucose, Bld: 129 mg/dL — ABNORMAL HIGH (ref 70–99)
Potassium: 4.4 mEq/L (ref 3.5–5.1)
Sodium: 138 mEq/L (ref 135–145)
TOTAL PROTEIN: 7.1 g/dL (ref 6.0–8.3)
Total Bilirubin: 0.2 mg/dL (ref 0.2–1.2)

## 2017-03-20 LAB — HEMOGLOBIN A1C: HEMOGLOBIN A1C: 6.9 % — AB (ref 4.6–6.5)

## 2017-03-20 NOTE — Assessment & Plan Note (Signed)
BP usually well controlled - will monitor No change in medication cmp

## 2017-03-20 NOTE — Patient Instructions (Addendum)
  Test(s) ordered today. Your results will be released to MyChart (or called to you) after review, usually within 72hours after test completion. If any changes need to be made, you will be notified at that same time.  All other Health Maintenance issues reviewed.   All recommended immunizations and age-appropriate screenings are up-to-date or discussed.  tdap immunization administered today.   Medications reviewed and updated.  No changes recommended at this time.  A referral was ordered for an eye doctor.   Please followup in 3 months

## 2017-03-20 NOTE — Assessment & Plan Note (Signed)
Check a1c Low sugar / carb diet Stressed regular exercise, keeping weight down - has gained weight

## 2017-04-29 DIAGNOSIS — N3020 Other chronic cystitis without hematuria: Secondary | ICD-10-CM | POA: Diagnosis not present

## 2017-06-18 NOTE — Patient Instructions (Addendum)
  Your a1c was done today to look at your sugar control.    Flu immunization administered today.     Medications reviewed and updated.  No changes recommended at this time.   Please followup in 6 months

## 2017-06-18 NOTE — Progress Notes (Signed)
Subjective:    Patient ID: [name], female    DOB: 1981-02-25, 36 y.o.   MRN: [id number]  HPI The patient is here for follow up.  Her son is here interpreting.   Diabetes: She is taking her medication daily as prescribed. She is compliant with a diabetic diet. She is exercising.  She feels more tired but is not sleeping as much.  She is not eating as much due to fatigue.  She has lost a few pounds.   She has no concerns.   Medications and allergies reviewed with patient and updated if appropriate.  Patient Active Problem List   Diagnosis Date Noted  . Elevated blood pressure reading 03/20/2017  . 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 Dysuria 12/05/2016  . Diabetes (HCC) 08/07/2013  . NONSPEC REACT TUBERCULIN SKIN TEST W/O ACTIVE TB 10/25/2010    Current Outpatient Prescriptions on File Prior to Visit  Medication Sig Dispense Refill  . glipiZIDE (GLUCOTROL) 10 MG tablet Take 1 tablet (10 mg total) by mouth 2 (two) times daily before a meal. 60 tablet 5  . metFORMIN (GLUCOPHAGE) 500 MG tablet Take 1 tablet (500 mg total) by mouth 2 (two) times daily with a meal. 180 tablet 3   No current facility-administered medications on file prior to visit.     Past Medical History:  Diagnosis Date  . Diabetes mellitus without complication (HCC)    on Metformin    Past Surgical History:  Procedure Laterality Date  . LAPAROSCOPIC BILATERAL SALPINGECTOMY Bilateral 07/19/2014   Procedure: LAPAROSCOPIC BILATERAL SALPINGECTOMY;  Surgeon: Catalina Antigua, MD;  Location: [facility];  Service: Gynecology;  Laterality: Bilateral;    Social History   Social History  . Marital status: Single    Spouse name: N/A  . Number of children: N/A  . Years of education: N/A   Social History Main Topics  . Smoking status: Never Smoker  . Smokeless tobacco: Never Used  . Alcohol use No  . Drug use: No  . Sexual activity: Not Currently    Birth control/ protection: Surgical   Other Topics Concern  .  None   Social History Narrative  . None    Family History  Problem Relation Age of Onset  . Diabetes Mother     Review of Systems  Constitutional: Positive for appetite change (dec). Negative for fever.  Respiratory: Negative for cough, shortness of breath and wheezing.   Cardiovascular: Negative for chest pain, palpitations and leg swelling.  Neurological: Positive for light-headedness (occ). Negative for headaches.       Objective:   Vitals:   06/19/17 0934  BP: 124/86  Pulse: 94  Resp: 16  Temp: 98 F (36.7 C)  SpO2: 98%   Wt Readings from Last 3 Encounters:  06/19/17 134 lb (60.8 kg)  03/20/17 138 lb (62.6 kg)  12/05/16 128 lb (58.1 kg)   Body mass index is 23 kg/m.   Physical Exam    Constitutional: Appears well-developed and well-nourished. No distress.  HENT:  Head: Normocephalic and atraumatic.  Neck: Neck supple. No tracheal deviation present. No thyromegaly present.  No cervical lymphadenopathy Cardiovascular: Normal rate, regular rhythm and normal heart sounds.   No murmur heard. No carotid bruit .  No edema Pulmonary/Chest: Effort normal and breath sounds normal. No respiratory distress. No has no wheezes. No rales.  Skin: Skin is warm and dry. Not diaphoretic.  Psychiatric: Normal mood and affect. Behavior is normal.      Assessment & Plan:  See Problem List for Assessment and Plan of chronic medical problems.

## 2017-06-19 ENCOUNTER — Ambulatory Visit (INDEPENDENT_AMBULATORY_CARE_PROVIDER_SITE_OTHER): Payer: 59 | Admitting: Internal Medicine

## 2017-06-19 ENCOUNTER — Encounter: Payer: Self-pay | Admitting: Internal Medicine

## 2017-06-19 VITALS — BP 124/86 | HR 94 | Temp 98.0°F | Resp 16 | Wt 134.0 lb

## 2017-06-19 DIAGNOSIS — Z23 Encounter for immunization: Secondary | ICD-10-CM | POA: Diagnosis not present

## 2017-06-19 DIAGNOSIS — E11319 Type 2 diabetes mellitus with unspecified diabetic retinopathy without macular edema: Secondary | ICD-10-CM | POA: Diagnosis not present

## 2017-06-19 LAB — POCT GLYCOSYLATED HEMOGLOBIN (HGB A1C): Hemoglobin A1C: 7.6

## 2017-06-19 MED ORDER — METFORMIN HCL 500 MG PO TABS: 1000.0000 mg | ORAL_TABLET | Freq: Two times a day (BID) | ORAL | 3 refills | Status: DC

## 2017-06-19 NOTE — Assessment & Plan Note (Addendum)
Check a1c -  Lab Results  Component Value Date   HGBA1C 7.6 06/19/2017   Not controlled Continue glipizide 10 mg twice daily Increase metformin to 1000 mg twice daily  F/u in 6 months

## 2017-10-09 ENCOUNTER — Other Ambulatory Visit: Payer: Self-pay | Admitting: Internal Medicine

## 2017-10-31 ENCOUNTER — Encounter: Payer: Self-pay | Admitting: Internal Medicine

## 2017-10-31 DIAGNOSIS — E113393 Type 2 diabetes mellitus with moderate nonproliferative diabetic retinopathy without macular edema, bilateral: Secondary | ICD-10-CM | POA: Diagnosis not present

## 2017-10-31 LAB — HM DIABETES EYE EXAM

## 2017-12-17 NOTE — Progress Notes (Signed)
Subjective:    Patient ID: [name], female    DOB: 1981-03-12, 37 y.o.   MRN: [id number]  HPI The patient is here for follow up.  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 She is compliant with a diabetic diet. She is exercising regularly - walking daily. She denies any numbness/tingling in her feet.       Medications and allergies reviewed with patient and updated if appropriate.  Patient Active Problem List   Diagnosis Date Noted  . Elevated blood pressure reading 03/20/2017  . 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 Dysuria 12/05/2016  . Diabetes (HCC) 08/07/2013  . NONSPEC REACT TUBERCULIN SKIN TEST W/O ACTIVE TB 10/25/2010    No current outpatient medications on file prior to visit.   No current facility-administered medications on file prior to visit.     Past Medical History:  Diagnosis Date  . Diabetes mellitus without complication (HCC)    on Metformin    Past Surgical History:  Procedure Laterality Date  . LAPAROSCOPIC BILATERAL SALPINGECTOMY Bilateral 07/19/2014   Procedure: LAPAROSCOPIC BILATERAL SALPINGECTOMY;  Surgeon: Catalina AntiguaPeggy Constant, MD;  Location: [facility];  Service: Gynecology;  Laterality: Bilateral;    Social History   Socioeconomic History  . Marital status: Single    Spouse name: Not on file  . Number of children: Not on file  . Years of education: Not on file  . Highest education level: Not on file  Occupational History  . Not on file  Social Needs  . Financial resource strain: Not on file  . Food insecurity:    Worry: Not on file    Inability: Not on file  . Transportation needs:    Medical: Not on file    Non-medical: Not on file  Tobacco Use  . Smoking status: Never Smoker  . Smokeless tobacco: Never Used  Substance and Sexual Activity  . Alcohol use: No  . Drug use: No  . Sexual activity: Not Currently    Birth control/protection: Surgical  Lifestyle  . Physical activity:    Days per week: Not on  file    Minutes per session: Not on file  . Stress: Not on file  Relationships  . Social connections:    Talks on phone: Not on file    Gets together: Not on file    Attends religious service: Not on file    Active member of club or organization: Not on file    Attends meetings of clubs or organizations: Not on file    Relationship status: Not on file  Other Topics Concern  . Not on file  Social History Narrative  . Not on file    Family History  Problem Relation Age of Onset  . Diabetes Mother     Review of Systems  Constitutional: Negative for chills and fever.  Respiratory: Negative for cough, shortness of breath and wheezing.   Cardiovascular: Negative for chest pain, palpitations and leg swelling.  Neurological: Positive for headaches (occ). Negative for light-headedness.       Objective:   Vitals:   12/18/17 0847  BP: 130/84  Pulse: (!) 109  Resp: 16  Temp: 98.6 F (37 C)  SpO2: 98%   BP Readings from Last 3 Encounters:  12/18/17 130/84  06/19/17 124/86  03/20/17 (!) 144/78   Wt Readings from Last 3 Encounters:  12/18/17 136 lb (61.7 kg)  06/19/17 134 lb (60.8 kg)  03/20/17 138 lb (62.6 kg)   Body  mass index is 23.34 kg/m.   Physical Exam    Constitutional: Appears well-developed and well-nourished. No distress.  HENT:  Head: Normocephalic and atraumatic.  Neck: Neck supple. No tracheal deviation present. No thyromegaly present.  No cervical lymphadenopathy Cardiovascular: Normal rate, regular rhythm and normal heart sounds.   No murmur heard. No carotid bruit .  No edema Pulmonary/Chest: Effort normal and breath sounds normal. No respiratory distress. No has no wheezes. No rales.  Skin: Skin is warm and dry. Not diaphoretic.  Psychiatric: Normal mood and affect. Behavior is normal.   Diabetic Foot Exam - Simple   Simple Foot Form Diabetic Foot exam was performed with the following findings:  Yes 12/18/2017  9:01 AM  Visual Inspection No  deformities, no ulcerations, no other skin breakdown bilaterally:  Yes Sensation Testing Intact to touch and monofilament testing bilaterally:  Yes Pulse Check Posterior Tibialis and Dorsalis pulse intact bilaterally:  Yes Comments       Assessment & Plan:    See Problem List for Assessment and Plan of chronic medical problems.

## 2017-12-18 ENCOUNTER — Encounter: Payer: Self-pay | Admitting: Internal Medicine

## 2017-12-18 ENCOUNTER — Ambulatory Visit (INDEPENDENT_AMBULATORY_CARE_PROVIDER_SITE_OTHER): Payer: 59 | Admitting: Internal Medicine

## 2017-12-18 ENCOUNTER — Other Ambulatory Visit (INDEPENDENT_AMBULATORY_CARE_PROVIDER_SITE_OTHER): Payer: 59

## 2017-12-18 VITALS — BP 130/84 | HR 109 | Temp 98.6°F | Resp 16 | Wt 136.0 lb

## 2017-12-18 DIAGNOSIS — Z114 Encounter for screening for human immunodeficiency virus [HIV]: Secondary | ICD-10-CM

## 2017-12-18 DIAGNOSIS — E11319 Type 2 diabetes mellitus with unspecified diabetic retinopathy without macular edema: Secondary | ICD-10-CM

## 2017-12-18 LAB — LIPID PANEL
CHOLESTEROL: 158 mg/dL (ref 0–200)
HDL: 42.3 mg/dL (ref >39.00)
NonHDL: 115.52
Total CHOL/HDL Ratio: 4
Triglycerides: 245 mg/dL — ABNORMAL HIGH (ref 0.0–149.0)
VLDL: 49 mg/dL — ABNORMAL HIGH (ref 0.0–40.0)

## 2017-12-18 LAB — COMPREHENSIVE METABOLIC PANEL
ALBUMIN: 3.7 g/dL (ref 3.5–5.2)
ALK PHOS: 57 U/L (ref 39–117)
ALT: 24 U/L (ref 0–35)
AST: 16 U/L (ref 0–37)
BUN: 14 mg/dL (ref 6–23)
CO2: 26 mEq/L (ref 19–32)
CREATININE: 0.58 mg/dL (ref 0.40–1.20)
Calcium: 9.5 mg/dL (ref 8.4–10.5)
Chloride: 101 mEq/L (ref 96–112)
GFR: 124.01 mL/min (ref >60.00)
GLUCOSE: 260 mg/dL — AB (ref 70–99)
Potassium: 4.3 mEq/L (ref 3.5–5.1)
SODIUM: 136 meq/L (ref 135–145)
TOTAL PROTEIN: 7.3 g/dL (ref 6.0–8.3)
Total Bilirubin: 0.2 mg/dL (ref 0.2–1.2)

## 2017-12-18 LAB — MICROALBUMIN / CREATININE URINE RATIO
CREATININE, U: 90.3 mg/dL
MICROALB UR: 9.4 mg/dL — AB (ref 0.0–1.9)
MICROALB/CREAT RATIO: 10.4 mg/g (ref 0.0–30.0)

## 2017-12-18 LAB — LDL CHOLESTEROL, DIRECT: LDL DIRECT: 95 mg/dL

## 2017-12-18 LAB — HEMOGLOBIN A1C: Hgb A1c MFr Bld: 8.5 % — ABNORMAL HIGH (ref 4.6–6.5)

## 2017-12-18 MED ORDER — GLIPIZIDE 10 MG PO TABS: ORAL_TABLET | ORAL | 3 refills | Status: DC

## 2017-12-18 MED ORDER — METFORMIN HCL 500 MG PO TABS: 1000.0000 mg | ORAL_TABLET | Freq: Two times a day (BID) | ORAL | 3 refills | Status: DC

## 2017-12-18 NOTE — Assessment & Plan Note (Signed)
Check a1c, cmp, urine micro, lipid Low sugar / carb diet Continue regular exercise

## 2017-12-18 NOTE — Patient Instructions (Signed)

## 2017-12-19 LAB — HIV ANTIBODY (ROUTINE TESTING W REFLEX): HIV 1&2 Ab, 4th Generation: NONREACTIVE

## 2017-12-21 ENCOUNTER — Other Ambulatory Visit: Payer: Self-pay | Admitting: Internal Medicine

## 2017-12-21 MED ORDER — SAXAGLIPTIN HCL 5 MG PO TABS: 5.0000 mg | ORAL_TABLET | Freq: Every day | ORAL | 5 refills | Status: DC

## 2017-12-23 ENCOUNTER — Encounter: Payer: Self-pay | Admitting: Emergency Medicine

## 2018-05-22 ENCOUNTER — Other Ambulatory Visit: Payer: Self-pay | Admitting: Internal Medicine

## 2018-05-31 ENCOUNTER — Other Ambulatory Visit: Payer: Self-pay | Admitting: Internal Medicine

## 2018-06-18 NOTE — Progress Notes (Signed)
Subjective:    Patient ID: [name], female    DOB: 09-16-1981, 37 y.o.   MRN: [id number]  HPI The patient is here for follow up.  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 She is compliant with a diabetic diet. She has occasional feeling of low sugar and eats something.  It only occurs on occasion.  She is active at work but not exercising regularly.  She checks her feet daily and denies foot lesions. She is up-to-date with an ophthalmology examination.   She has no other concerns.  Medications and allergies reviewed with patient and updated if appropriate.  Patient Active Problem List   Diagnosis Date Noted  . 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 Diabetes (HCC) 08/07/2013  . NONSPEC REACT TUBERCULIN SKIN TEST W/O ACTIVE TB 10/25/2010    Current Outpatient Medications on File Prior to Visit  Medication Sig Dispense Refill  . glipiZIDE (GLUCOTROL) 10 MG tablet TAKE 1 TABLET(10 MG) BY MOUTH TWICE DAILY BEFORE A MEAL 180 tablet 3  . metFORMIN (GLUCOPHAGE) 500 MG tablet Take 2 tablets (1,000 mg total) by mouth 2 (two) times daily with a meal. 360 tablet 3   No current facility-administered medications on file prior to visit.     Past Medical History:  Diagnosis Date  . Diabetes mellitus without complication (HCC)    on Metformin    Past Surgical History:  Procedure Laterality Date  . LAPAROSCOPIC BILATERAL SALPINGECTOMY Bilateral 07/19/2014   Procedure: LAPAROSCOPIC BILATERAL SALPINGECTOMY;  Surgeon: Catalina Antigua, MD;  Location: [facility];  Service: Gynecology;  Laterality: Bilateral;    Social History   Socioeconomic History  . Marital status: Single    Spouse name: Not on file  . Number of children: Not on file  . Years of education: Not on file  . Highest education level: Not on file  Occupational History  . Not on file  Social Needs  . Financial resource strain: Not on file  . Food insecurity:    Worry: Not on file    Inability:  Not on file  . Transportation needs:    Medical: Not on file    Non-medical: Not on file  Tobacco Use  . Smoking status: Never Smoker  . Smokeless tobacco: Never Used  Substance and Sexual Activity  . Alcohol use: No  . Drug use: No  . Sexual activity: Not Currently    Birth control/protection: Surgical  Lifestyle  . Physical activity:    Days per week: Not on file    Minutes per session: Not on file  . Stress: Not on file  Relationships  . Social connections:    Talks on phone: Not on file    Gets together: Not on file    Attends religious service: Not on file    Active member of club or organization: Not on file    Attends meetings of clubs or organizations: Not on file    Relationship status: Not on file  Other Topics Concern  . Not on file  Social History Narrative  . Not on file    Family History  Problem Relation Age of Onset  . Diabetes Mother     Review of Systems  Constitutional: Negative for chills and fever.  Eyes: Negative for visual disturbance.  Respiratory: Negative for cough, shortness of breath and wheezing.   Cardiovascular: Negative for palpitations and leg swelling.  Neurological: Negative for light-headedness and headaches.       Objective:  Vitals:   06/19/18 0845  BP: 122/84  Pulse: 88  Resp: 16  Temp: 98.4 F (36.9 C)  SpO2: 98%   BP Readings from Last 3 Encounters:  06/19/18 122/84  12/18/17 130/84  06/19/17 124/86   Wt Readings from Last 3 Encounters:  06/19/18 139 lb (63 kg)  12/18/17 136 lb (61.7 kg)  06/19/17 134 lb (60.8 kg)   Body mass index is 23.86 kg/m.   Physical Exam    Constitutional: Appears well-developed and well-nourished. No distress.  HENT:  Head: Normocephalic and atraumatic.  Neck: Neck supple. No tracheal deviation present. No thyromegaly present.  No cervical lymphadenopathy Cardiovascular: Normal rate, regular rhythm and normal heart sounds.   No murmur heard. No carotid bruit .  No  edema Pulmonary/Chest: Effort normal and breath sounds normal. No respiratory distress. No has no wheezes. No rales.  Skin: Skin is warm and dry. Not diaphoretic.  Psychiatric: Normal mood and affect. Behavior is normal.      Assessment & Plan:    See Problem List for Assessment and Plan of chronic medical problems.

## 2018-06-18 NOTE — Patient Instructions (Addendum)
  Tests ordered today. Your results will be released to MyChart (or called to you) after review, usually within 72hours after test completion. If any changes need to be made, you will be notified at that same time.  Flu immunization administered today.   Medications reviewed and updated.  Changes include :   none  Your prescription(s) have been submitted to your pharmacy. Please take as directed and contact our office if you believe you are having problem(s) with the medication(s).   Please followup in 6 months   

## 2018-06-19 ENCOUNTER — Ambulatory Visit: Payer: 59 | Admitting: Internal Medicine

## 2018-06-19 ENCOUNTER — Other Ambulatory Visit (INDEPENDENT_AMBULATORY_CARE_PROVIDER_SITE_OTHER): Payer: 59

## 2018-06-19 ENCOUNTER — Encounter: Payer: Self-pay | Admitting: Internal Medicine

## 2018-06-19 VITALS — BP 122/84 | HR 88 | Temp 98.4°F | Resp 16 | Ht 64.0 in | Wt 139.0 lb

## 2018-06-19 DIAGNOSIS — E11319 Type 2 diabetes mellitus with unspecified diabetic retinopathy without macular edema: Secondary | ICD-10-CM | POA: Diagnosis not present

## 2018-06-19 DIAGNOSIS — Z23 Encounter for immunization: Secondary | ICD-10-CM | POA: Diagnosis not present

## 2018-06-19 LAB — HEMOGLOBIN A1C: HEMOGLOBIN A1C: 8.4 % — AB (ref 4.6–6.5)

## 2018-06-19 LAB — COMPREHENSIVE METABOLIC PANEL
ALK PHOS: 46 U/L (ref 39–117)
ALT: 18 U/L (ref 0–35)
AST: 14 U/L (ref 0–37)
Albumin: 3.6 g/dL (ref 3.5–5.2)
BILIRUBIN TOTAL: 0.2 mg/dL (ref 0.2–1.2)
BUN: 10 mg/dL (ref 6–23)
CO2: 25 meq/L (ref 19–32)
CREATININE: 0.58 mg/dL (ref 0.40–1.20)
Calcium: 9 mg/dL (ref 8.4–10.5)
Chloride: 107 mEq/L (ref 96–112)
GFR: 123.67 mL/min (ref >60.00)
GLUCOSE: 186 mg/dL — AB (ref 70–99)
Potassium: 3.6 mEq/L (ref 3.5–5.1)
SODIUM: 140 meq/L (ref 135–145)
TOTAL PROTEIN: 7 g/dL (ref 6.0–8.3)

## 2018-06-19 MED ORDER — SAXAGLIPTIN HCL 5 MG PO TABS: 5.0000 mg | ORAL_TABLET | Freq: Every day | ORAL | 3 refills | Status: DC

## 2018-06-19 NOTE — Assessment & Plan Note (Signed)
Check a1c Low sugar / carb diet Stressed regular exercise FU in 6 months 

## 2018-06-24 ENCOUNTER — Other Ambulatory Visit: Payer: Self-pay | Admitting: Internal Medicine

## 2018-06-24 MED ORDER — EMPAGLIFLOZIN 10 MG PO TABS: 10.0000 mg | ORAL_TABLET | Freq: Every day | ORAL | 5 refills | Status: DC

## 2018-07-18 ENCOUNTER — Other Ambulatory Visit: Payer: Self-pay | Admitting: Internal Medicine

## 2018-08-04 ENCOUNTER — Other Ambulatory Visit: Payer: Self-pay | Admitting: Internal Medicine

## 2018-08-04 NOTE — Telephone Encounter (Signed)
Copied from CRM [id number]. Topic: Quick Communication - Rx Refill/Question >> Aug 04, 2018  9:52 AM Marylen PontoMcneil, Ja-Kwan wrote: Medication: metFORMIN (GLUCOPHAGE) 500 MG tablet  Has the patient contacted their pharmacy? yes   Preferred Pharmacy (with phone number or street name): [id number] - [place], [place] - [address] AT [facility] [address] & [address] [phone] (Phone)  [phone] (Fax)  Agent: Please be advised that RX refills may take up to 3 business days. We ask that you follow-up with your pharmacy.

## 2018-08-05 MED ORDER — METFORMIN HCL 500 MG PO TABS: 1000.0000 mg | ORAL_TABLET | Freq: Two times a day (BID) | ORAL | 3 refills | Status: DC

## 2018-12-03 DIAGNOSIS — E103313 Type 1 diabetes mellitus with moderate nonproliferative diabetic retinopathy with macular edema, bilateral: Secondary | ICD-10-CM | POA: Diagnosis not present

## 2018-12-03 LAB — HM DIABETES EYE EXAM

## 2018-12-04 ENCOUNTER — Encounter: Payer: Self-pay | Admitting: Internal Medicine

## 2018-12-21 ENCOUNTER — Ambulatory Visit: Payer: 59 | Admitting: Internal Medicine

## 2018-12-23 NOTE — Progress Notes (Signed)
Virtual Visit via Video Note  I connected with [name] on 12/23/18 at 10:30 AM EDT by a video enabled telemedicine application and verified that I am speaking with the correct person using two identifiers.   I discussed the limitations of evaluation and management by telemedicine and the availability of in person appointments. The patient expressed understanding and agreed to proceed.  The patient is currently at home and I am in the office.  Her son interprets.  No referring provider.    History of Present Illness: She is here for follow up of her chronic medical conditions.   She is active at work, and does some walking.    Diabetes: She is taking her medication daily as prescribed. She is fairly compliant with a diabetic diet.   She is up-to-date with an ophthalmology examination.   She has no concerns.   Social History   Socioeconomic History  . Marital status: Single    Spouse name: Not on file  . Number of children: Not on file  . Years of education: Not on file  . Highest education level: Not on file  Occupational History  . Not on file  Social Needs  . Financial resource strain: Not on file  . Food insecurity:    Worry: Not on file    Inability: Not on file  . Transportation needs:    Medical: Not on file    Non-medical: Not on file  Tobacco Use  . Smoking status: Never Smoker  . Smokeless tobacco: Never Used  Substance and Sexual Activity  . Alcohol use: No  . Drug use: No  . Sexual activity: Not Currently    Birth control/protection: Surgical  Lifestyle  . Physical activity:    Days per week: Not on file    Minutes per session: Not on file  . Stress: Not on file  Relationships  . Social connections:    Talks on phone: Not on file    Gets together: Not on file    Attends religious service: Not on file    Active member of club or organization: Not on file    Attends meetings of clubs or organizations: Not on file    Relationship status: Not on  file  Other Topics Concern  . Not on file  Social History Narrative  . Not on file     Observations/Objective: Appears well in NAD  Lab Results  Component Value Date   HGBA1C 8.4 (H) 06/19/2018     Assessment and Plan:  See Problem List for Assessment and Plan of chronic medical problems.   Follow Up Instructions:    I discussed the assessment and treatment plan with the patient. The patient was provided an opportunity to ask questions and all were answered. The patient agreed with the plan and demonstrated an understanding of the instructions.   The patient was advised to call back or seek an in-person evaluation if the symptoms worsen or if the condition fails to improve as anticipated.    Pincus Sanes, MD

## 2018-12-24 ENCOUNTER — Encounter: Payer: Self-pay | Admitting: Internal Medicine

## 2018-12-24 ENCOUNTER — Ambulatory Visit (INDEPENDENT_AMBULATORY_CARE_PROVIDER_SITE_OTHER): Payer: 59 | Admitting: Internal Medicine

## 2018-12-24 DIAGNOSIS — E11319 Type 2 diabetes mellitus with unspecified diabetic retinopathy without macular edema: Secondary | ICD-10-CM | POA: Diagnosis not present

## 2018-12-24 NOTE — Assessment & Plan Note (Signed)
Taking all of her medication as prescribed She states she is fairly compliant with a diabetic diet She does not check her sugars at home.  She does not want to stick herself She is very active-active at work and does some walking We will hold off on A1c at this time since we are trying to keep her going out of the office Advised that we should have her come for a visit late summer so that we can check her A1c to make sure that her sugars are controlled If sugars are not ideally controlled at that time we will increase the Jardiance Follow-up July-August

## 2018-12-29 ENCOUNTER — Other Ambulatory Visit: Payer: Self-pay | Admitting: Internal Medicine

## 2019-02-01 ENCOUNTER — Other Ambulatory Visit: Payer: Self-pay

## 2019-02-01 ENCOUNTER — Encounter (HOSPITAL_COMMUNITY): Payer: Self-pay | Admitting: Emergency Medicine

## 2019-02-01 DIAGNOSIS — R51 Headache: Secondary | ICD-10-CM | POA: Diagnosis present

## 2019-02-01 DIAGNOSIS — E119 Type 2 diabetes mellitus without complications: Secondary | ICD-10-CM | POA: Diagnosis not present

## 2019-02-01 DIAGNOSIS — Z79899 Other long term (current) drug therapy: Secondary | ICD-10-CM | POA: Diagnosis not present

## 2019-02-01 DIAGNOSIS — G44209 Tension-type headache, unspecified, not intractable: Secondary | ICD-10-CM | POA: Diagnosis not present

## 2019-02-01 DIAGNOSIS — Z7984 Long term (current) use of oral hypoglycemic drugs: Secondary | ICD-10-CM | POA: Diagnosis not present

## 2019-02-01 DIAGNOSIS — M549 Dorsalgia, unspecified: Secondary | ICD-10-CM | POA: Insufficient documentation

## 2019-02-01 LAB — CBG MONITORING, ED: Glucose-Capillary: 293 mg/dL — ABNORMAL HIGH (ref 70–99)

## 2019-02-01 NOTE — ED Triage Notes (Signed)
Pt reports having headache that started yesterday along with body aches and no relief from Advil.

## 2019-02-02 ENCOUNTER — Emergency Department (HOSPITAL_COMMUNITY)
Admission: EM | Admit: 2019-02-02 | Discharge: 2019-02-02 | Disposition: A | Discharge status: Home / Self Care | Payer: 59 | Attending: Emergency Medicine | Admitting: Emergency Medicine

## 2019-02-02 DIAGNOSIS — G44209 Tension-type headache, unspecified, not intractable: Secondary | ICD-10-CM

## 2019-02-02 NOTE — Discharge Instructions (Addendum)
You may use over-the-counter Motrin (Ibuprofen), Acetaminophen (Tylenol), topical muscle creams such as SalonPas, Icy Hot, Bengay, etc. Please stretch, apply heat, and have massage therapy for additional assistance. ° °

## 2019-02-02 NOTE — ED Provider Notes (Signed)
[facility]-EMERGENCY DEPT Provider Note  CSN: [id number] Arrival date & time: 02/01/19 2111  Chief Complaint(s) Headache  HPI [name] is a 38 y.o. female   The history is provided by the patient.  Headache  Pain location:  Generalized Quality:  Dull Radiates to:  Does not radiate Onset quality:  Gradual Timing:  Intermittent Progression:  Waxing and waning Chronicity:  Recurrent Similar to prior headaches: yes   Relieved by:  NSAIDs Associated symptoms: back pain   Associated symptoms: no blurred vision, no cough, no fever, no loss of balance, no neck pain, no neck stiffness, no numbness, no paresthesias, no photophobia and no visual change     Past Medical History Past Medical History:  Diagnosis Date  . Diabetes mellitus without complication (HCC)    on Metformin   Patient Active Problem List   Diagnosis Date Noted  . 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 Diabetes (HCC) 08/07/2013  . 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 Authorizing Provider  empagliflozin (JARDIANCE) 10 MG TABS tablet Take 10 mg by mouth daily. 06/24/18   Burns, Bobette Mo, MD  glipiZIDE (GLUCOTROL) 10 MG tablet TAKE 1 TABLET(10 MG) BY MOUTH TWICE DAILY BEFORE A MEAL 12/18/17   Burns, Bobette Mo, MD  metFORMIN (GLUCOPHAGE) 500 MG tablet Take 2 tablets (1,000 mg total) by mouth 2 (two) times daily with a meal. 08/05/18   Burns, Bobette Mo, MD  saxagliptin HCl (ONGLYZA) 5 MG TABS tablet Take 1 tablet (5 mg total) by mouth daily. 06/19/18   Pincus Sanes, MD                                                                                                                                    Past Surgical History Past Surgical History:  Procedure Laterality Date  . LAPAROSCOPIC BILATERAL SALPINGECTOMY Bilateral 07/19/2014   Procedure: LAPAROSCOPIC BILATERAL SALPINGECTOMY;  Surgeon: Catalina Antigua, MD;  Location: [facility];  Service: Gynecology;  Laterality: Bilateral;   Family History Family History  Problem Relation Age of Onset  . Diabetes Mother     Social History Social History   Tobacco Use  . Smoking status: Never Smoker  . Smokeless tobacco: Never Used  Substance Use Topics  . Alcohol use: No  . Drug use: No   Allergies Patient has no known allergies.  Review of Systems Review of Systems  Constitutional: Negative for fever.  Eyes: Negative for blurred vision and photophobia.  Respiratory: Negative for cough.   Musculoskeletal: Positive for back pain. Negative for neck pain and neck stiffness.  Neurological: Positive for headaches. Negative for numbness, paresthesias and loss of balance.   All other systems are reviewed and are negative for acute change except as noted in the HPI  Physical Exam Vital Signs  I have reviewed the  triage vital signs BP (!) 144/91 (BP Location: Left Arm)   Pulse 97   Temp 98.3 F (36.8 C) (Oral)   Resp 18   Ht  (1.575 m)   Wt 63 kg   LMP 01/07/2019   SpO2 100%   BMI 25.42 kg/m   Physical Exam Vitals signs reviewed.  Constitutional:      General: She is not in acute distress.    Appearance: She is well-developed. She is not diaphoretic.  HENT:     Head: Normocephalic and atraumatic.     Right Ear: External ear normal.     Left Ear: External ear normal.     Nose: Nose normal.  Eyes:     General: No scleral icterus.    Conjunctiva/sclera: Conjunctivae normal.  Neck:     Musculoskeletal: Normal range of motion.     Trachea: Phonation normal.  Cardiovascular:     Rate and Rhythm: Normal rate and regular rhythm.  Pulmonary:     Effort: Pulmonary effort is normal. No respiratory distress.     Breath sounds: No stridor.  Abdominal:     General: There is no distension.  Musculoskeletal: Normal range of motion.     Cervical back: She exhibits tenderness and spasm. She exhibits no bony tenderness.        Back:  Neurological:     Mental Status: She is alert and oriented to person, place, and time.     Sensory: Sensation is intact.     Motor: Motor function is intact.  Psychiatric:        Behavior: Behavior normal.     ED Results and Treatments Labs (all labs ordered are listed, but only abnormal results are displayed) Labs Reviewed  CBG MONITORING, ED - Abnormal; Notable for the following components:      Result Value   Glucose-Capillary 293 (*)    All other components within normal limits                                                                                                                         EKG  EKG Interpretation  Date/Time:    Ventricular Rate:    PR Interval:    QRS Duration:   QT Interval:    QTC Calculation:   R Axis:     Text Interpretation:        Radiology No results found. Pertinent labs & imaging results that were available during my care of the patient were reviewed by me and considered in my medical decision making (see chart for details).  Medications Ordered in ED Medications - No data to display  Procedures Procedures  (including critical care time)  Medical Decision Making / ED Course I have reviewed the nursing notes for this encounter and the patient's prior records (if available in EHR or on provided paperwork).    Typical tension headache for the pt. Non focal neuro exam. No recent head trauma. No fever. Doubt meningitis. Doubt intracranial bleed. Doubt IIH. No indication for imaging.   Discussed additional supportive and symptomatic management.  The patient appears reasonably screened and/or stabilized for discharge and I doubt any other medical condition or other [facility] requiring further screening, evaluation, or treatment in the ED at this time prior to discharge.  The patient is safe for discharge  with strict return precautions.     Final Clinical Impression(s) / ED Diagnoses Final diagnoses:  Muscle tension headache    Disposition: Discharge  Condition: Good  I have discussed the results, Dx and Tx plan with the patient who expressed understanding and agree(s) with the plan. Discharge instructions discussed at great length. The patient was given strict return precautions who verbalized understanding of the instructions. No further questions at time of discharge.    ED Discharge Orders    None       Follow Up: Pincus SanesBurns, Stacy J, MD [address] [place] [id number] [phone]  Schedule an appointment as soon as possible for a visit  As needed     This chart was dictated using voice recognition software.  Despite best efforts to proofread,  errors can occur which can change the documentation meaning.   Nira Connardama,  Eduardo, MD 02/02/19 [phone]

## 2019-06-17 ENCOUNTER — Other Ambulatory Visit: Payer: Self-pay | Admitting: Internal Medicine

## 2019-06-29 ENCOUNTER — Telehealth: Payer: Self-pay | Admitting: Internal Medicine

## 2019-06-29 NOTE — Telephone Encounter (Signed)
-----   Message from Binnie Rail, MD sent at 06/17/2019  2:01 PM EDT ----- She is due for a f/u with me this month for dm

## 2019-06-29 NOTE — Telephone Encounter (Signed)
Left message for patient to call back to schedule.  °

## 2019-07-07 ENCOUNTER — Other Ambulatory Visit: Payer: Self-pay | Admitting: Internal Medicine

## 2019-08-11 ENCOUNTER — Ambulatory Visit (HOSPITAL_COMMUNITY)
Admission: EM | Admit: 2019-08-11 | Discharge: 2019-08-12 | Disposition: A | Discharge status: Home / Self Care | Payer: 59 | Attending: Emergency Medicine | Admitting: Emergency Medicine

## 2019-08-11 ENCOUNTER — Encounter (HOSPITAL_COMMUNITY): Payer: Self-pay | Admitting: Emergency Medicine

## 2019-08-11 ENCOUNTER — Other Ambulatory Visit: Payer: Self-pay

## 2019-08-11 DIAGNOSIS — Z20828 Contact with and (suspected) exposure to other viral communicable diseases: Secondary | ICD-10-CM | POA: Diagnosis not present

## 2019-08-11 DIAGNOSIS — T18128A Food in esophagus causing other injury, initial encounter: Secondary | ICD-10-CM | POA: Diagnosis present

## 2019-08-11 DIAGNOSIS — E119 Type 2 diabetes mellitus without complications: Secondary | ICD-10-CM | POA: Insufficient documentation

## 2019-08-11 DIAGNOSIS — Z7984 Long term (current) use of oral hypoglycemic drugs: Secondary | ICD-10-CM | POA: Insufficient documentation

## 2019-08-11 DIAGNOSIS — X58XXXA Exposure to other specified factors, initial encounter: Secondary | ICD-10-CM | POA: Diagnosis not present

## 2019-08-11 DIAGNOSIS — T18108A Unspecified foreign body in esophagus causing other injury, initial encounter: Secondary | ICD-10-CM

## 2019-08-11 NOTE — ED Triage Notes (Signed)
Patient ate fish Monday this week and felt fish bone stuck in throat , airway intact / no oral swelling , respirations unlabored, pain when swallowing and speaking .

## 2019-08-12 ENCOUNTER — Encounter (HOSPITAL_COMMUNITY): Payer: Self-pay

## 2019-08-12 ENCOUNTER — Ambulatory Visit (HOSPITAL_COMMUNITY): Admit: 2019-08-12 | Payer: 59 | Admitting: Gastroenterology

## 2019-08-12 ENCOUNTER — Emergency Department (HOSPITAL_COMMUNITY): Payer: 59 | Admitting: Registered Nurse

## 2019-08-12 ENCOUNTER — Emergency Department (HOSPITAL_COMMUNITY): Payer: 59

## 2019-08-12 ENCOUNTER — Encounter (HOSPITAL_COMMUNITY)
Admission: EM | Disposition: A | Discharge status: Home / Self Care | Payer: Self-pay | Source: Home / Self Care | Attending: Emergency Medicine

## 2019-08-12 HISTORY — PX: FOREIGN BODY REMOVAL: SHX962

## 2019-08-12 HISTORY — PX: ESOPHAGOGASTRODUODENOSCOPY: SHX5428

## 2019-08-12 LAB — POCT I-STAT, CHEM 8
BUN: 3 mg/dL — ABNORMAL LOW (ref 6–20)
Calcium, Ion: 1.3 mmol/L (ref 1.15–1.40)
Chloride: 103 mmol/L (ref 98–111)
Creatinine, Ser: 0.4 mg/dL — ABNORMAL LOW (ref 0.44–1.00)
Glucose, Bld: 130 mg/dL — ABNORMAL HIGH (ref 70–99)
HCT: 36 % (ref 36.0–46.0)
Hemoglobin: 12.2 g/dL (ref 12.0–15.0)
Potassium: 3.7 mmol/L (ref 3.5–5.1)
Sodium: 141 mmol/L (ref 135–145)
TCO2: 26 mmol/L (ref 22–32)

## 2019-08-12 LAB — SARS CORONAVIRUS 2 BY RT PCR (HOSPITAL ORDER, PERFORMED IN ~~LOC~~ HOSPITAL LAB): SARS Coronavirus 2: NEGATIVE

## 2019-08-12 LAB — CBG MONITORING, ED: Glucose-Capillary: 130 mg/dL — ABNORMAL HIGH (ref 70–99)

## 2019-08-12 LAB — POC SARS CORONAVIRUS 2 AG -  ED: SARS Coronavirus 2 Ag: NEGATIVE

## 2019-08-12 SURGERY — EGD (ESOPHAGOGASTRODUODENOSCOPY)
Anesthesia: Moderate Sedation

## 2019-08-12 SURGERY — EGD (ESOPHAGOGASTRODUODENOSCOPY)
Anesthesia: General

## 2019-08-12 MED ORDER — SODIUM CHLORIDE 0.9 % IV SOLN: INTRAVENOUS | Status: DC

## 2019-08-12 MED ORDER — IOHEXOL 300 MG/ML  SOLN
50.0000 mL | Freq: Once | INTRAMUSCULAR | Status: AC | PRN
  Administered 2019-08-12: 10 mL via ORAL

## 2019-08-12 MED ORDER — ONDANSETRON HCL 4 MG/2ML IJ SOLN
INTRAMUSCULAR | Status: DC | PRN
  Administered 2019-08-12: 4 mg via INTRAVENOUS

## 2019-08-12 MED ORDER — PROPOFOL 10 MG/ML IV BOLUS
INTRAVENOUS | Status: DC | PRN
  Administered 2019-08-12: 150 mg via INTRAVENOUS

## 2019-08-12 MED ORDER — IOHEXOL 300 MG/ML  SOLN
50.0000 mL | Freq: Once | INTRAMUSCULAR | Status: AC | PRN
  Administered 2019-08-12: 11:00:00 50 mL via ORAL

## 2019-08-12 MED ORDER — LACTATED RINGERS IV SOLN
INTRAVENOUS | Status: DC | PRN
  Administered 2019-08-12: 08:00:00 via INTRAVENOUS

## 2019-08-12 MED ORDER — ROCURONIUM BROMIDE 10 MG/ML (PF) SYRINGE
PREFILLED_SYRINGE | INTRAVENOUS | Status: DC | PRN
  Administered 2019-08-12: 50 mg via INTRAVENOUS

## 2019-08-12 MED ORDER — SUCCINYLCHOLINE CHLORIDE 200 MG/10ML IV SOSY
PREFILLED_SYRINGE | INTRAVENOUS | Status: DC | PRN
  Administered 2019-08-12: 100 mg via INTRAVENOUS

## 2019-08-12 MED ORDER — FENTANYL CITRATE (PF) 100 MCG/2ML IJ SOLN
INTRAMUSCULAR | Status: DC | PRN
  Administered 2019-08-12: 50 ug via INTRAVENOUS

## 2019-08-12 MED ORDER — LIDOCAINE 2% (20 MG/ML) 5 ML SYRINGE
INTRAMUSCULAR | Status: DC | PRN
  Administered 2019-08-12: 60 mg via INTRAVENOUS

## 2019-08-12 MED ORDER — DEXAMETHASONE SODIUM PHOSPHATE 10 MG/ML IJ SOLN
INTRAMUSCULAR | Status: DC | PRN
  Administered 2019-08-12: 4 mg via INTRAVENOUS

## 2019-08-12 MED ORDER — SUGAMMADEX SODIUM 200 MG/2ML IV SOLN
INTRAVENOUS | Status: DC | PRN
  Administered 2019-08-12: 240 mg via INTRAVENOUS

## 2019-08-12 NOTE — Anesthesia Procedure Notes (Signed)
Procedure Name: Intubation Date/Time: 08/12/2019 8:15 AM Performed by: Leonor Liv, CRNA Pre-anesthesia Checklist: Patient identified, Emergency Drugs available, Suction available and Patient being monitored Patient Re-evaluated:Patient Re-evaluated prior to induction Oxygen Delivery Method: Circle System Utilized Preoxygenation: Pre-oxygenation with 100% oxygen Induction Type: IV induction, Rapid sequence and Cricoid Pressure applied Laryngoscope Size: Mac and 3 Grade View: Grade I Tube type: Oral Tube size: 7.0 mm Number of attempts: 1 Airway Equipment and Method: Stylet and Oral airway Placement Confirmation: ETT inserted through vocal cords under direct vision,  positive ETCO2 and breath sounds checked- equal and bilateral Secured at: 21 cm Tube secured with: Tape Dental Injury: Teeth and Oropharynx as per pre-operative assessment

## 2019-08-12 NOTE — ED Notes (Signed)
No Diet was ordered for Lunch Pt. NPO. 

## 2019-08-12 NOTE — Discharge Instructions (Addendum)
Please return here for concerning changes otherwise follow-up with our gastroenterology colleague.

## 2019-08-12 NOTE — Anesthesia Preprocedure Evaluation (Signed)
Anesthesia Evaluation  Patient identified by MRN, date of birth, ID band Patient awake    Reviewed: Allergy & Precautions, NPO status , Patient's Chart, lab work & pertinent test results  Airway Mallampati: II  TM Distance: >3 FB     Dental   Pulmonary neg pulmonary ROS,    breath sounds clear to auscultation       Cardiovascular negative cardio ROS   Rhythm:Regular Rate:Normal     Neuro/Psych negative neurological ROS  negative psych ROS   GI/Hepatic Neg liver ROS, History noted CG   Endo/Other  diabetes  Renal/GU negative Renal ROS     Musculoskeletal   Abdominal   Peds  Hematology   Anesthesia Other Findings   Reproductive/Obstetrics                             Anesthesia Physical Anesthesia Plan  ASA: III  Anesthesia Plan: General   Post-op Pain Management:    Induction: Intravenous, Rapid sequence and Cricoid pressure planned  PONV Risk Score and Plan: 3 and Ondansetron, Dexamethasone and Midazolam  Airway Management Planned: Oral ETT  Additional Equipment:   Intra-op Plan:   Post-operative Plan: Extubation in OR  Informed Consent: I have reviewed the patients History and Physical, chart, labs and discussed the procedure including the risks, benefits and alternatives for the proposed anesthesia with the patient or authorized representative who has indicated his/her understanding and acceptance.     Dental advisory given  Plan Discussed with: CRNA, Anesthesiologist and Surgeon  Anesthesia Plan Comments:         Anesthesia Quick Evaluation

## 2019-08-12 NOTE — ED Provider Notes (Signed)
Emergency Department Provider Note   I have reviewed the triage vital signs and the nursing notes.   HISTORY  Chief Complaint Fish Bone in Throat   HPI [name] is a 38 y.o. female who presents with foreign body sensation in her throat ever since eating fish a few days ago.  She thinks he may have a fishbone stuck.  She is able to tolerate solids and liquids however she has pain with both.  No vomiting.  No fevers.  No other chest pain.  No other associated symptoms.  No history of the same.   No other associated or modifying symptoms.    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  Family History  Problem Relation Age of Onset   Diabetes Mother     Social History Social History   Tobacco Use   Smoking status: Never Smoker   Smokeless tobacco: Never Used  Substance Use Topics   Alcohol use: No   Drug use: No    Review of Systems  All other systems negative except as documented in the HPI. All pertinent positives and negatives as reviewed in the HPI. ____________________________________________   PHYSICAL EXAM:  VITAL SIGNS: ED Triage Vitals  Enc Vitals Group     BP 08/11/19 2327 138/82     Pulse Rate 08/11/19 2327 (!) 102     Resp 08/11/19  2327 20     Temp 08/11/19 2327 99 F (37.2 C)     Temp Source 08/11/19 2327 Oral     SpO2 08/11/19 2327 99 %     Weight --      Height --      Head Circumference --      Peak Flow --      Pain Score 08/11/19 2355 0     Pain Loc --      Pain Edu? --      Excl. in [place]? --     Constitutional: Alert and oriented. Well appearing and in no acute distress. Eyes: Conjunctivae are normal. PERRL. EOMI. Head: Atraumatic. Nose: No congestion/rhinnorhea. Mouth/Throat: Mucous membranes are moist.  Oropharynx non-erythematous. Neck: No stridor.  No meningeal signs.   Cardiovascular: Normal rate, regular rhythm. Good peripheral circulation. Grossly normal heart sounds.   Respiratory: Normal respiratory effort.  No retractions. Lungs CTAB. Gastrointestinal: Soft and nontender. No distention.  Musculoskeletal: No lower extremity tenderness nor edema. No gross deformities of extremities. Neurologic:  Normal speech and language. No gross focal neurologic deficits are appreciated.  Skin:  Skin is warm, dry and intact. No rash noted.   ____________________________________________   LABS (all labs ordered are listed, but only abnormal results are displayed)  Labs Reviewed  SARS CORONAVIRUS 2 BY RT PCR (HOSPITAL ORDER, [facility])  POC SARS  CORONAVIRUS 2 AG -  ED   ____________________________________________  RADIOLOGY  Dg Neck Soft Tissue  Result Date: 08/12/2019 CLINICAL DATA:  Possible fish bone stuck in throat EXAM: NECK SOFT TISSUES - 1+ VIEW COMPARISON:  None. FINDINGS: No prevertebral soft tissue swelling is noted. The hyoid and thyroid cartilage are within normal limits. There is a linear thin calcification which measures approximately 9 mm in the proximal esophagus. This is consistent with the patient's given clinical history of ingested fishbone. No other focal abnormality is seen. IMPRESSION: Small linear density in the proximal esophagus consistent with  ingested fishbone Electronically Signed   By: Alcide Clever M.D.   On: 08/12/2019 00:28    ____________________________________________   PROCEDURES  Procedure(s) performed:   Procedures   ____________________________________________   INITIAL IMPRESSION / ASSESSMENT AND PLAN / ED COURSE  Patient with linear opacity around the level of C6 approximately 3 cm distal to the larynx in her esophagus.  She is tolerating secretions.  Vital signs within normal limits.  Labs are fine.  Discussed with GI who will see here for further management.  Clinical Course as of Aug 11 622  Thu Aug 12, 2019  0528 SARS Coronavirus 2 by RT PCR (hospital order, performed in [facility] hospital lab) Nasopharyngeal Nasopharyngeal Swab [PC]    Clinical Course User Index [PC] Cardama, Amadeo Garnet, MD    Pertinent labs & imaging results that were available during my care of the patient were reviewed by me and considered in my medical decision making (see chart for details). ____________________________________________  FINAL CLINICAL IMPRESSION(S) / ED DIAGNOSES  Final diagnoses:  Foreign body in esophagus, initial encounter     MEDICATIONS GIVEN DURING THIS VISIT:  Medications - No data to display   NEW OUTPATIENT MEDICATIONS STARTED DURING THIS VISIT:  New Prescriptions   No medications on file    Note:  This note was prepared with assistance of Dragon voice recognition software. Occasional wrong-word or sound-a-like substitutions may have occurred due to the inherent limitations of voice recognition software.   Reita Shindler, Barbara Cower, MD 08/12/19 [phone]

## 2019-08-12 NOTE — ED Notes (Signed)
Pt back from swallow study

## 2019-08-12 NOTE — Transfer of Care (Signed)
Immediate Anesthesia Transfer of Care Note  Patient: [name]  Procedure(s) Performed: ESOPHAGOGASTRODUODENOSCOPY (EGD) (N/A ) FOREIGN BODY REMOVAL (N/A )  Patient Location: Endoscopy Unit  Anesthesia Type:General  Level of Consciousness: awake, alert  and oriented  Airway & Oxygen Therapy: Patient Spontanous Breathing and Patient connected to nasal cannula oxygen  Post-op Assessment: Report given to RN, Post -op Vital signs reviewed and stable and Patient moving all extremities X 4  Post vital signs: Reviewed and stable  Last Vitals:  Vitals Value Taken Time  BP 128/75 08/12/19 0856  Temp 36.9 C 08/12/19 0856  Pulse 96 08/12/19 0856  Resp 22 08/12/19 0856  SpO2 100 % 08/12/19 0856    Last Pain:  Vitals:   08/12/19 0856  TempSrc: Oral  PainSc: 0-No pain         Complications: No apparent anesthesia complications

## 2019-08-12 NOTE — ED Notes (Signed)
Patient returned from Radiology. 

## 2019-08-12 NOTE — Consult Note (Signed)
Reason for Consult: Impacted fishbone. Referring Physician: ER MD  [name] is an 38 y.o. female.  HPI: [name] is a 38 year old [place] female who gives a history of consuming fish on Monday, 08/09/2019 when she had a sensation of a bone stuck in her esophagus.  She claims she tried to consume some bread but been a banana and some rice but could not get the fishbone down she denies having any odynophagia but has some dysphagia.  She has been able to eat soft foods since this happened.  She denies any history of GI bleeding.  She denies any medical problems besides diabetes for which she takes Metformin.  There is no there are no other GI complaints  Past Medical History:  Diagnosis Date  . Diabetes mellitus without complication ([place])    on Metformin   Past Surgical History:  Procedure Laterality Date  . LAPAROSCOPIC BILATERAL SALPINGECTOMY Bilateral 07/19/2014   Procedure: LAPAROSCOPIC BILATERAL SALPINGECTOMY;  Surgeon: Mora Bellman, MD;  Location: [facility];  Service: Gynecology;  Laterality: Bilateral;   Family History  Problem Relation Age of Onset  . Diabetes Mother    Social History:  reports that she has never smoked. She has never used smokeless tobacco. She reports that she does not drink alcohol or use drugs.  Allergies: No Known Allergies  Medications: I have reviewed the patient's current medications.  Results for orders placed or performed during the hospital encounter of 08/11/19 (from the past 48 hour(s))  SARS Coronavirus 2 by RT PCR (hospital order, performed in [facility] hospital lab) Nasopharyngeal Nasopharyngeal Swab     Status: None   Collection Time: 08/12/19  5:05 AM   Specimen: Nasopharyngeal Swab  Result Value Ref Range   SARS Coronavirus 2 NEGATIVE NEGATIVE    Comment: (NOTE) SARS-CoV-2 target nucleic acids are NOT DETECTED. The SARS-CoV-2 RNA is generally detectable in upper and lower respiratory specimens during the acute phase of infection. The  lowest concentration of SARS-CoV-2 viral copies this assay can detect is 250 copies / mL. A negative result does not preclude SARS-CoV-2 infection and should not be used as the sole basis for treatment or other patient management decisions.  A negative result may occur with improper specimen collection / handling, submission of specimen other than nasopharyngeal swab, presence of viral mutation(s) within the areas targeted by this assay, and inadequate number of viral copies (<250 copies / mL). A negative result must be combined with clinical observations, patient history, and epidemiological information. Fact Sheet for Patients:   StrictlyIdeas.no Fact Sheet for Healthcare Providers: BankingDealers.co.za This test is not yet approved or cleared  by the [place] FDA and has been authorized for detection and/or diagnosis of SARS-CoV-2 by FDA under an Emergency Use Authorization (EUA).  This EUA will remain in effect (meaning this test can be used) for the duration of the COVID-19 declaration under Section 564(b)(1) of the Act, 21 U.S.C. section 360bbb-3(b)(1), unless the authorization is terminated or revoked sooner. Performed at [facility], [place] [address] [place], [place] [address]   POC SARS Coronavirus 2 Ag-ED - Nasal Swab (BD Veritor Kit)     Status: None   Collection Time: 08/12/19  5:59 AM  Result Value Ref Range   SARS Coronavirus 2 Ag NEGATIVE NEGATIVE    Comment: (NOTE) SARS-CoV-2 antigen NOT DETECTED.  Negative results are presumptive.  Negative results do not preclude SARS-CoV-2 infection and should not be used as the sole basis for treatment or other patient  management decisions, including infection  control decisions, particularly in the presence of clinical signs and  symptoms consistent with COVID-19, or in those who have been in contact with the virus.  Negative results must be combined with clinical  observations, patient history, and epidemiological information. The expected result is Negative. Fact Sheet for Patients: PodPark.tn Fact Sheet for Healthcare Providers: GiftContent.is This test is not yet approved or cleared by the [place] FDA and  has been authorized for detection and/or diagnosis of SARS-CoV-2 by FDA under an Emergency Use Authorization (EUA).  This EUA will remain in effect (meaning this test can be used) for the duration of  the COVID-19 de claration under Section 564(b)(1) of the Act, 21 U.S.C. section 360bbb-3(b)(1), unless the authorization is terminated or revoked sooner.     Dg Neck Soft Tissue  Result Date: 08/12/2019 CLINICAL DATA:  Possible fish bone stuck in throat EXAM: NECK SOFT TISSUES - 1+ VIEW COMPARISON:  None. FINDINGS: No prevertebral soft tissue swelling is noted. The hyoid and thyroid cartilage are within normal limits. There is a linear thin calcification which measures approximately 9 mm in the proximal esophagus. This is consistent with the patient's given clinical history of ingested fishbone. No other focal abnormality is seen. IMPRESSION: Small linear density in the proximal esophagus consistent with ingested fishbone Electronically Signed   By: Inez Catalina M.D.   On: 08/12/2019 00:28   ROS Blood pressure 139/85, pulse 100, temperature 98.7 F (37.1 C), temperature source Oral, resp. rate 15, height 5' 2"  (1.575 m), weight 59.9 kg, last menstrual period 07/14/2019, SpO2 98 %. Physical Exam  Constitutional: She is oriented to person, place, and time. She appears well-developed and well-nourished.  HENT:  Head: Normocephalic and atraumatic.  Eyes: Pupils are equal, round, and reactive to light. Conjunctivae and EOM are normal.  Neck: Normal range of motion. Neck supple.  Cardiovascular: Normal rate and regular rhythm.  Respiratory: Effort normal and breath sounds normal.  GI:  Soft. Bowel sounds are normal.  Neurological: She is alert and oriented to person, place, and time.  Skin: Skin is warm and dry.   Assessment/Plan: Dysphagia with an impacted fish bon in proximal esophagus-proceed with an EGD at this time. Case discussed with anesthesia-intubation planned for further endoscopy due to the fishbone noted in the proximal esophagus on plain films.  [name] 08/12/2019, 7:18 AM

## 2019-08-12 NOTE — ED Notes (Signed)
Patient transported to radiology for swallow study 

## 2019-08-12 NOTE — ED Notes (Signed)
Endo came and got patient

## 2019-08-12 NOTE — ED Notes (Signed)
Patient verbalizes understanding of discharge instructions. Opportunity for questioning and answers were provided. Armband removed by staff, pt discharged from ED ambulatory to home.  

## 2019-08-12 NOTE — Anesthesia Postprocedure Evaluation (Signed)
Anesthesia Post Note  Patient: Annalee Buntin  Procedure(s) Performed: ESOPHAGOGASTRODUODENOSCOPY (EGD) (N/A ) FOREIGN BODY REMOVAL (N/A )     Patient location during evaluation: PACU Anesthesia Type: General Level of consciousness: awake Pain management: pain level controlled Vital Signs Assessment: post-procedure vital signs reviewed and stable Respiratory status: spontaneous breathing Cardiovascular status: stable Postop Assessment: no apparent nausea or vomiting Anesthetic complications: no    Last Vitals:  Vitals:   08/12/19 0701 08/12/19 0856  BP: 139/85 128/75  Pulse: 100 96  Resp: 15 (!) 22  Temp:  36.9 C  SpO2: 98% 100%    Last Pain:  Vitals:   08/12/19 0856  TempSrc: Oral  PainSc: 0-No pain                 Glorya Bartley

## 2019-08-12 NOTE — ED Notes (Signed)
C/o sorethroat on the right side when she eats or swallows, states she was eating fish on Monday night and thinks she got a bone in her throat.

## 2019-08-16 NOTE — Op Note (Addendum)
[facility] Patient Name: [name] Procedure Date : 08/12/2019 MRN: [id number] Attending MD: Juanita Craver , MD Date of Birth: 07-02-1981 CSN: [id number] Age: 38 Admit Type: Emergency Department Procedure:                EGD with removal of fish bone impacted in the                            proximal esophagus Indications:              Foreign body in the esophagus-fish bone in proximal                            esophagus. Providers:                Juanita Craver, MD, Burtis Junes, RN, Laverda Sorenson,                            Technician, Trixie Deis, CRNA, Celestia Khat,                            MD Referring MD:             Binnie Rail, MD Medicines:                Monitored Anesthesia Care Complications:            No immediate complications. Estimated Blood Loss:     Estimated blood loss was minimal. Procedure:                Pre-Anesthesia Assessment: - Prior to the                            procedure, a history and physical was performed,                            and patient medications and allergies were                            reviewed. The patient's tolerance of previous                            anesthesia was also reviewed. The risks and                            benefits of the procedure and the sedation options                            and risks were discussed with the patient. All                            questions were answered, and informed consent was                            obtained. Prior Anticoagulants: The patient has  taken no previous anticoagulant or antiplatelet                            agents. ASA Grade Assessment: II - A patient with                            mild systemic disease. After reviewing the risks                            and benefits, the patient was deemed in                            satisfactory condition to undergo the procedure.                            After obtaining  informed consent, the endoscope was                            passed under direct vision. Throughout the                            procedure, the patient's blood pressure, pulse, and                            oxygen saturations were monitored continuously. The                            [id number]-H190 ([id number] Olympus gastroscope was                            introduced through the mouth, and advanced to the                            second part of duodenum. The EGD was accomplished                            without difficulty. The patient tolerated the                            procedure well. Scope In: Scope Out: Findings:      A 9 mm fish bone was noted in the proximal esophagus with ulceration at       the base of the impaction; the was gently removed with a rat tooth       forceps.The rest of the esophagus appeared widely patent and normal..      The entire examined stomach appeared was normal.      The cardia and gastric fundus were normal on retroflexion.      The examined duodenum was normal. Impression:               - 9 mm fish bone impacted in the proximal                            esophagus-removed by a rat tooth forceps.                           -  Normal appearing stomach.                           - Normal examined duodenum.                           - No specimens collected. Moderate Sedation:      MAC used. Recommendation:           - NPO for now.                           - Perform an gastrograffin esophagram today-ASAP to                            rule out esophageal perforation prio to discharge.                           - Soft low residue diet if the esophagogram is                            normal.                           - OP follow up in 1 week. Procedure Code(s):        --- Professional ---                           [phone], Esophagogastroduodenoscopy, flexible,                            transoral; with removal of foreign body(s) Diagnosis Code(s):         --- Professional ---                           T18.108A, Unspecified foreign body in esophagus                            causing other injury, initial encounter CPT copyright 2019 American Medical Association. All rights reserved. The codes documented in this report are preliminary and upon coder review may  be revised to meet current compliance requirements. Juanita Craver, MD Juanita Craver, MD 08/12/2019 10:34:18 AM This report has been signed electronically. Number of Addenda: 0

## 2019-09-27 ENCOUNTER — Other Ambulatory Visit: Payer: Self-pay | Admitting: Internal Medicine

## 2019-10-21 ENCOUNTER — Other Ambulatory Visit: Payer: Self-pay

## 2019-10-21 MED ORDER — GLIPIZIDE 10 MG PO TABS: 10.0000 mg | ORAL_TABLET | Freq: Two times a day (BID) | ORAL | 0 refills | Status: DC

## 2019-11-12 ENCOUNTER — Other Ambulatory Visit: Payer: Self-pay | Admitting: Internal Medicine

## 2019-11-15 ENCOUNTER — Other Ambulatory Visit: Payer: Self-pay | Admitting: Internal Medicine

## 2020-03-02 ENCOUNTER — Other Ambulatory Visit: Payer: Self-pay | Admitting: Internal Medicine

## 2020-03-09 NOTE — Patient Instructions (Addendum)
  Blood work was ordered.     Medications reviewed and updated.  Changes include :   None   Your prescription(s) have been submitted to your pharmacy. Please take as directed and contact our office if you believe you are having problem(s) with the medication(s).     Please followup in 6 months   

## 2020-03-09 NOTE — Progress Notes (Signed)
Subjective:    Patient ID: [name], female    DOB: [birth year], 39 y.o.   MRN: [id number]  HPI The patient is here for follow up of their chronic medical problems, including DM.   She is exercising regularly.   She walks.   She feels she is eating well.  She is taking her medication daily.  She needs refills today.  Left thumb pain.  She has had this for a while.  She denies any injury.  It does hurt with certain movements.   Medications and allergies reviewed with patient and updated if appropriate.  Patient Active Problem List   Diagnosis Date Noted  . Pain of left thumb 03/10/2020  . 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 Diabetes ([place]) 08/07/2013  . [place] SKIN TEST W/O ACTIVE TB 10/25/2010    No current outpatient medications on file prior to visit.   No current facility-administered medications on file prior to visit.    Past Medical History:  Diagnosis Date  . Diabetes mellitus without complication ([place])    on Metformin    Past Surgical History:  Procedure Laterality Date  . ESOPHAGOGASTRODUODENOSCOPY N/A 08/12/2019   Procedure: ESOPHAGOGASTRODUODENOSCOPY (EGD);  Surgeon: Juanita Craver, MD;  Location: [facility] ENDOSCOPY;  Service: Endoscopy;  Laterality: N/A;  . FOREIGN BODY REMOVAL N/A 08/12/2019   Procedure: FOREIGN BODY REMOVAL;  Surgeon: Juanita Craver, MD;  Location: [facility] ENDOSCOPY;  Service: Endoscopy;  Laterality: N/A;  . LAPAROSCOPIC BILATERAL SALPINGECTOMY Bilateral 07/19/2014   Procedure: LAPAROSCOPIC BILATERAL SALPINGECTOMY;  Surgeon: Mora Bellman, MD;  Location: [facility];  Service: Gynecology;  Laterality: Bilateral;    Social History   Socioeconomic History  . Marital status: Single    Spouse name: Not on file  . Number of children: Not on file  . Years of education: Not on file  . Highest education level: Not on file  Occupational History  . Not on file  Tobacco Use  . Smoking status: Never Smoker  . Smokeless tobacco: Never Used    Substance and Sexual Activity  . Alcohol use: No  . Drug use: No  . Sexual activity: Not Currently    Birth control/protection: Surgical  Other Topics Concern  . Not on file  Social History Narrative  . Not on file   Social Determinants of Health   Financial Resource Strain:   . Difficulty of Paying Living Expenses:   Food Insecurity:   . Worried About Charity fundraiser in the Last Year:   . Arboriculturist in the Last Year:   Transportation Needs:   . Film/video editor (Medical):   Marland Kitchen Lack of Transportation (Non-Medical):   Physical Activity:   . Days of Exercise per Week:   . Minutes of Exercise per Session:   Stress:   . Feeling of Stress :   Social Connections:   . Frequency of Communication with Friends and Family:   . Frequency of Social Gatherings with Friends and Family:   . Attends Religious Services:   . Active Member of Clubs or Organizations:   . Attends Archivist Meetings:   Marland Kitchen Marital Status:     Family History  Problem Relation Age of Onset  . Diabetes Mother     Review of Systems  Constitutional: Negative for fever.  Eyes: Negative for visual disturbance.  Respiratory: Negative for cough, shortness of breath and wheezing.   Cardiovascular: Negative for chest pain, palpitations and leg swelling.  Neurological: Negative for  dizziness and headaches.       Objective:   Vitals:   03/10/20 1041  BP: 120/70  Pulse: 92  Temp: 97.9 F (36.6 C)  SpO2: 99%   BP Readings from Last 3 Encounters:  03/10/20 120/70  08/12/19 118/67  02/02/19 111/79   Wt Readings from Last 3 Encounters:  03/10/20 141 lb (64 kg)  08/12/19 132 lb (59.9 kg)  02/01/19 139 lb (63 kg)   Body mass index is 25.79 kg/m.   Physical Exam    Constitutional: Appears well-developed and well-nourished. No distress.  HENT:  Head: Normocephalic and atraumatic.  Neck: Neck supple. No tracheal deviation present. No thyromegaly present.  No cervical  lymphadenopathy Cardiovascular: Normal rate, regular rhythm and normal heart sounds.   No murmur heard. No carotid bruit .  No edema Pulmonary/Chest: Effort normal and breath sounds normal. No respiratory distress. No has no wheezes. No rales. Musculoskeletal: Left thumb without deformity or swelling.  Normal sensation.  Area of pain is MCP joint, DIP does lock with flexion and extension Skin: Skin is warm and dry. Not diaphoretic.  Psychiatric: Normal mood and affect. Behavior is normal.      Assessment & Plan:    See Problem List for Assessment and Plan of chronic medical problems.    This visit occurred during the SARS-CoV-2 public health emergency.  Safety protocols were in place, including screening questions prior to the visit, additional usage of staff PPE, and extensive cleaning of exam room while observing appropriate contact time as indicated for disinfecting solutions.

## 2020-03-10 ENCOUNTER — Encounter: Payer: Self-pay | Admitting: Internal Medicine

## 2020-03-10 ENCOUNTER — Ambulatory Visit: Payer: 59 | Admitting: Internal Medicine

## 2020-03-10 ENCOUNTER — Other Ambulatory Visit: Payer: Self-pay

## 2020-03-10 VITALS — BP 120/70 | HR 92 | Temp 97.9°F | Ht 62.0 in | Wt 141.0 lb

## 2020-03-10 DIAGNOSIS — M79645 Pain in left finger(s): Secondary | ICD-10-CM | POA: Diagnosis not present

## 2020-03-10 DIAGNOSIS — E11319 Type 2 diabetes mellitus with unspecified diabetic retinopathy without macular edema: Secondary | ICD-10-CM

## 2020-03-10 LAB — COMPREHENSIVE METABOLIC PANEL
ALT: 44 U/L — ABNORMAL HIGH (ref 0–35)
AST: 35 U/L (ref 0–37)
Albumin: 4.1 g/dL (ref 3.5–5.2)
Alkaline Phosphatase: 60 U/L (ref 39–117)
BUN: 12 mg/dL (ref 6–23)
CO2: 25 mEq/L (ref 19–32)
Calcium: 10.2 mg/dL (ref 8.4–10.5)
Chloride: 104 mEq/L (ref 96–112)
Creatinine, Ser: 0.6 mg/dL (ref 0.40–1.20)
GFR: 110.89 mL/min (ref >60.00)
Glucose, Bld: 136 mg/dL — ABNORMAL HIGH (ref 70–99)
Potassium: 4 mEq/L (ref 3.5–5.1)
Sodium: 138 mEq/L (ref 135–145)
Total Bilirubin: 0.4 mg/dL (ref 0.2–1.2)
Total Protein: 7.8 g/dL (ref 6.0–8.3)

## 2020-03-10 LAB — LIPID PANEL
Cholesterol: 160 mg/dL (ref 0–200)
HDL: 46.5 mg/dL (ref >39.00)
LDL Cholesterol: 77 mg/dL (ref 0–99)
NonHDL: 113.68
Total CHOL/HDL Ratio: 3
Triglycerides: 184 mg/dL — ABNORMAL HIGH (ref 0.0–149.0)
VLDL: 36.8 mg/dL (ref 0.0–40.0)

## 2020-03-10 LAB — MICROALBUMIN / CREATININE URINE RATIO
Creatinine,U: 54.3 mg/dL
Microalb Creat Ratio: 13.3 mg/g (ref 0.0–30.0)
Microalb, Ur: 7.2 mg/dL — ABNORMAL HIGH (ref 0.0–1.9)

## 2020-03-10 LAB — HEMOGLOBIN A1C: Hgb A1c MFr Bld: 7.7 % — ABNORMAL HIGH (ref 4.6–6.5)

## 2020-03-10 MED ORDER — GLIPIZIDE 10 MG PO TABS: ORAL_TABLET | ORAL | 1 refills | Status: DC

## 2020-03-10 MED ORDER — SAXAGLIPTIN HCL 5 MG PO TABS: 5.0000 mg | ORAL_TABLET | Freq: Every day | ORAL | 1 refills | Status: DC

## 2020-03-10 MED ORDER — METFORMIN HCL 500 MG PO TABS: 1000.0000 mg | ORAL_TABLET | Freq: Two times a day (BID) | ORAL | 1 refills | Status: DC

## 2020-03-10 NOTE — Assessment & Plan Note (Signed)
Acute-new since her last visit At this time she does not feel the pain is bad enough to see anyone If she changes her mind she will establish with sports medicine downstairs

## 2020-03-10 NOTE — Assessment & Plan Note (Addendum)
Chronic Has been poorly controlled She is taking her medication daily She states she is walking a lot and eating well Check A1c, urine microalbumin, CMP, lipid panel Will adjust medications if necessary Has seen an eye doctor yearly for the past 2 years

## 2020-08-31 NOTE — Progress Notes (Signed)
Subjective:    Patient ID: [name], female    DOB: May 13, 1981, 39 y.o.   MRN: [id number]  HPI The patient is here for follow up of their chronic medical problems, including DM  She is taking all of her medications as prescribed.   She is exercising regularly.   She is compliant with a diabetic diet.  Sometimes during the day she feels her sugars go low and she has to eat to make them go up and then she feels better.  Overall she feels she is doing very well with taking her medications and eating well.    Medications and allergies reviewed with patient and updated if appropriate.  Patient Active Problem List   Diagnosis Date Noted  . Pain of left thumb 03/10/2020  . 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 Diabetes ([facility]) 08/07/2013  . NONSPEC REACT TUBERCULIN SKIN TEST W/O ACTIVE TB 10/25/2010    Current Outpatient Medications on File Prior to Visit  Medication Sig Dispense Refill  . glipiZIDE (GLUCOTROL) 10 MG tablet Take 1 tablet by mouth twice daily before a meal. 180 tablet 1  . metFORMIN (GLUCOPHAGE) 500 MG tablet Take 2 tablets (1,000 mg total) by mouth 2 (two) times daily with a meal. 360 tablet 1  . saxagliptin HCl (ONGLYZA) 5 MG TABS tablet Take 1 tablet (5 mg total) by mouth daily. 90 tablet 1   No current facility-administered medications on file prior to visit.    Past Medical History:  Diagnosis Date  . Diabetes mellitus without complication ([facility])    on Metformin    Past Surgical History:  Procedure Laterality Date  . ESOPHAGOGASTRODUODENOSCOPY N/A 08/12/2019   Procedure: ESOPHAGOGASTRODUODENOSCOPY (EGD);  Surgeon: Charna Elizabeth, MD;  Location: [facility] ENDOSCOPY;  Service: Endoscopy;  Laterality: N/A;  . FOREIGN BODY REMOVAL N/A 08/12/2019   Procedure: FOREIGN BODY REMOVAL;  Surgeon: Charna Elizabeth, MD;  Location: [facility] ENDOSCOPY;  Service: Endoscopy;  Laterality: N/A;  . LAPAROSCOPIC BILATERAL SALPINGECTOMY Bilateral 07/19/2014   Procedure: LAPAROSCOPIC BILATERAL  SALPINGECTOMY;  Surgeon: Catalina Antigua, MD;  Location: [facility];  Service: Gynecology;  Laterality: Bilateral;    Social History   Socioeconomic History  . Marital status: Single    Spouse name: Not on file  . Number of children: Not on file  . Years of education: Not on file  . Highest education level: Not on file  Occupational History  . Not on file  Tobacco Use  . Smoking status: Never Smoker  . Smokeless tobacco: Never Used  Substance and Sexual Activity  . Alcohol use: No  . Drug use: No  . Sexual activity: Not Currently    Birth control/protection: Surgical  Other Topics Concern  . Not on file  Social History Narrative  . Not on file   Social Determinants of Health   Financial Resource Strain: Not on file  Food Insecurity: Not on file  Transportation Needs: Not on file  Physical Activity: Not on file  Stress: Not on file  Social Connections: Not on file    Family History  Problem Relation Age of Onset  . Diabetes Mother     Review of Systems  Constitutional: Negative for fever.  Respiratory: Negative for cough, shortness of breath and wheezing.   Cardiovascular: Negative for chest pain, palpitations and leg swelling.  Neurological: Positive for headaches (occ). Negative for dizziness and light-headedness.       Objective:   Vitals:   09/01/20 0920  BP: 118/74  Pulse: (!) 106  Temp: 98 F (36.7 C)  SpO2: 98%   BP Readings from Last 3 Encounters:  09/01/20 118/74  03/10/20 120/70  08/12/19 118/67   Wt Readings from Last 3 Encounters:  09/01/20 138 lb 6.4 oz (62.8 kg)  03/10/20 141 lb (64 kg)  08/12/19 132 lb (59.9 kg)   Body mass index is 25.31 kg/m.   Physical Exam    Constitutional: Appears well-developed and well-nourished. No distress.  HENT:  Head: Normocephalic and atraumatic.  Neck: Neck supple. No tracheal deviation present. No thyromegaly present.  No cervical lymphadenopathy Cardiovascular: Normal rate, regular rhythm and  normal heart sounds.   No murmur heard. No carotid bruit .  No edema Pulmonary/Chest: Effort normal and breath sounds normal. No respiratory distress. No has no wheezes. No rales. Abdomen: Soft, nontender, nondistended Skin: Skin is warm and dry. Not diaphoretic.  Psychiatric: Normal mood and affect. Behavior is normal.      Assessment & Plan:    See Problem List for Assessment and Plan of chronic medical problems.    This visit occurred during the SARS-CoV-2 public health emergency.  Safety protocols were in place, including screening questions prior to the visit, additional usage of staff PPE, and extensive cleaning of exam room while observing appropriate contact time as indicated for disinfecting solutions.

## 2020-08-31 NOTE — Patient Instructions (Addendum)
  Blood work was ordered.     Flu immunization administered today.     Medications changes include :   none  Your prescription(s) have been submitted to your pharmacy. Please take as directed and contact our office if you believe you are having problem(s) with the medication(s).   Schedule a pap smear --->  [facility]  [address]  [address]  [place], [place] [address]  Main: [phone]     Please followup in 6 months

## 2020-09-01 ENCOUNTER — Other Ambulatory Visit: Payer: Self-pay

## 2020-09-01 ENCOUNTER — Encounter: Payer: Self-pay | Admitting: Internal Medicine

## 2020-09-01 ENCOUNTER — Ambulatory Visit: Payer: 59 | Admitting: Internal Medicine

## 2020-09-01 VITALS — BP 118/74 | HR 106 | Temp 98.0°F | Ht 62.0 in | Wt 138.4 lb

## 2020-09-01 DIAGNOSIS — E11319 Type 2 diabetes mellitus with unspecified diabetic retinopathy without macular edema: Secondary | ICD-10-CM

## 2020-09-01 DIAGNOSIS — Z23 Encounter for immunization: Secondary | ICD-10-CM

## 2020-09-01 LAB — LIPID PANEL
Cholesterol: 182 mg/dL (ref 0–200)
HDL: 53.5 mg/dL (ref >39.00)
LDL Cholesterol: 102 mg/dL — ABNORMAL HIGH (ref 0–99)
NonHDL: 128.37
Total CHOL/HDL Ratio: 3
Triglycerides: 134 mg/dL (ref 0.0–149.0)
VLDL: 26.8 mg/dL (ref 0.0–40.0)

## 2020-09-01 LAB — COMPREHENSIVE METABOLIC PANEL
ALT: 49 U/L — ABNORMAL HIGH (ref 0–35)
AST: 31 U/L (ref 0–37)
Albumin: 4 g/dL (ref 3.5–5.2)
Alkaline Phosphatase: 62 U/L (ref 39–117)
BUN: 12 mg/dL (ref 6–23)
CO2: 24 mEq/L (ref 19–32)
Calcium: 9.5 mg/dL (ref 8.4–10.5)
Chloride: 104 mEq/L (ref 96–112)
Creatinine, Ser: 0.59 mg/dL (ref 0.40–1.20)
GFR: 112.92 mL/min (ref >60.00)
Glucose, Bld: 164 mg/dL — ABNORMAL HIGH (ref 70–99)
Potassium: 4.1 mEq/L (ref 3.5–5.1)
Sodium: 135 mEq/L (ref 135–145)
Total Bilirubin: 0.5 mg/dL (ref 0.2–1.2)
Total Protein: 7.8 g/dL (ref 6.0–8.3)

## 2020-09-01 LAB — HEMOGLOBIN A1C: Hgb A1c MFr Bld: 9.1 % — ABNORMAL HIGH (ref 4.6–6.5)

## 2020-09-01 NOTE — Assessment & Plan Note (Addendum)
Chronic Continue metformin 1000 mg BID, onglyza 5 mg daily, glipizide 10 mg twice daily Check a1c-we will titrate medication if needed Subjective low sugars - does not want to check sugars - afraid of blood Low sugar / carb diet Stressed regular exercise

## 2020-09-05 MED ORDER — PIOGLITAZONE HCL 15 MG PO TABS: 15.0000 mg | ORAL_TABLET | Freq: Every day | ORAL | 1 refills | Status: DC

## 2020-09-05 NOTE — Addendum Note (Signed)
Addended by: Pincus Sanes on: 09/05/2020 07:34 AM   Modules accepted: Orders

## 2020-09-07 ENCOUNTER — Ambulatory Visit: Payer: 59 | Admitting: Internal Medicine

## 2020-09-12 ENCOUNTER — Other Ambulatory Visit: Payer: Self-pay | Admitting: Internal Medicine

## 2020-10-19 ENCOUNTER — Telehealth: Payer: Self-pay | Admitting: Internal Medicine

## 2020-10-19 NOTE — Telephone Encounter (Signed)
Patient assistance is available for Onglyza through [employer], however it might be a good time to re-evaluate her meds. We can just as easily get patient assistance for Rybelsus or Farxiga, either of which would be better at lowering A1c and would offer additional benefits that Onglyza does not have (weight loss, CV protection).

## 2020-10-19 NOTE — Telephone Encounter (Signed)
Patients daughter calling, states the patient does not currently have insurance and her diabetes medication is over 500 dollars and was wondering if there was anything we can do to help

## 2020-10-19 NOTE — Telephone Encounter (Signed)
Lab Results  Component Value Date   HGBA1C 9.1 (H) 09/01/2020     Would love to have her on rybelsus, farxiga and metformin if possible and stop the actos, onglyza and glipizide

## 2020-10-20 NOTE — Telephone Encounter (Signed)
Contacted patient's sister, she reports patient is not working right now and is without income or insurance.. We will pursue patient assistance for Rybelsus [employer]) and Marcelline Deist ([employer]).  She requested the applications be emailed to her at: [email or web address]  She will print the forms, have patient sign them, then fax them back to [place].

## 2020-11-03 NOTE — Addendum Note (Signed)
Addended by: Kathyrn Sheriff on: 11/03/2020 04:44 PM   Modules accepted: Orders

## 2020-11-03 NOTE — Addendum Note (Signed)
Addended by: Kathyrn Sheriff on: 11/03/2020 04:43 PM   Modules accepted: Orders

## 2020-12-06 ENCOUNTER — Telehealth: Payer: Self-pay | Admitting: Pharmacist

## 2020-12-06 NOTE — Telephone Encounter (Signed)
Received call from patient's sister asking about patient assistance.  We have applied for Rybelsus [employer]) and Marcelline Deist ([employer]) patient assistance and have not heard response yet.  Pt is asking if there are any alternatives she can take in the meantime because blood sugars are high, she only has metformin right now.  Contacted [facility] to fill 15-month supply of Farxiga 10 mg with Free Trial coupon.   We have samples of Rybelsus 3 mg as well. Signed out 1 box and left in front office cabinet for patient to pick up.

## 2021-01-18 ENCOUNTER — Encounter: Payer: Self-pay | Admitting: Internal Medicine

## 2021-02-09 ENCOUNTER — Encounter (HOSPITAL_COMMUNITY): Payer: Self-pay | Admitting: Emergency Medicine

## 2021-02-09 ENCOUNTER — Other Ambulatory Visit: Payer: Self-pay

## 2021-02-09 ENCOUNTER — Ambulatory Visit (HOSPITAL_COMMUNITY)
Admission: EM | Admit: 2021-02-09 | Discharge: 2021-02-09 | Disposition: A | Discharge status: Home / Self Care | Payer: 59 | Attending: Emergency Medicine | Admitting: Emergency Medicine

## 2021-02-09 DIAGNOSIS — Z7984 Long term (current) use of oral hypoglycemic drugs: Secondary | ICD-10-CM | POA: Insufficient documentation

## 2021-02-09 DIAGNOSIS — R000 Tachycardia, unspecified: Secondary | ICD-10-CM | POA: Insufficient documentation

## 2021-02-09 DIAGNOSIS — R52 Pain, unspecified: Secondary | ICD-10-CM

## 2021-02-09 DIAGNOSIS — E119 Type 2 diabetes mellitus without complications: Secondary | ICD-10-CM | POA: Insufficient documentation

## 2021-02-09 DIAGNOSIS — R519 Headache, unspecified: Secondary | ICD-10-CM | POA: Insufficient documentation

## 2021-02-09 DIAGNOSIS — M79609 Pain in unspecified limb: Secondary | ICD-10-CM | POA: Insufficient documentation

## 2021-02-09 DIAGNOSIS — R1032 Left lower quadrant pain: Secondary | ICD-10-CM | POA: Insufficient documentation

## 2021-02-09 DIAGNOSIS — M549 Dorsalgia, unspecified: Secondary | ICD-10-CM | POA: Insufficient documentation

## 2021-02-09 DIAGNOSIS — Z20822 Contact with and (suspected) exposure to covid-19: Secondary | ICD-10-CM | POA: Insufficient documentation

## 2021-02-09 LAB — CBC WITH DIFFERENTIAL/PLATELET
Abs Immature Granulocytes: 0.05 10*3/uL (ref 0.00–0.07)
Basophils Absolute: 0.1 10*3/uL (ref 0.0–0.1)
Basophils Relative: 0 %
Eosinophils Absolute: 0 10*3/uL (ref 0.0–0.5)
Eosinophils Relative: 0 %
HCT: 39.4 % (ref 36.0–46.0)
Hemoglobin: 13 g/dL (ref 12.0–15.0)
Immature Granulocytes: 0 %
Lymphocytes Relative: 12 %
Lymphs Abs: 2.2 10*3/uL (ref 0.7–4.0)
MCH: 25.9 pg — ABNORMAL LOW (ref 26.0–34.0)
MCHC: 33 g/dL (ref 30.0–36.0)
MCV: 78.6 fL — ABNORMAL LOW (ref 80.0–100.0)
Monocytes Absolute: 0.9 10*3/uL (ref 0.1–1.0)
Monocytes Relative: 5 %
Neutro Abs: 15 10*3/uL — ABNORMAL HIGH (ref 1.7–7.7)
Neutrophils Relative %: 83 %
Platelets: 321 10*3/uL (ref 150–400)
RBC: 5.01 MIL/uL (ref 3.87–5.11)
RDW: 13.9 % (ref 11.5–15.5)
WBC: 18.3 10*3/uL — ABNORMAL HIGH (ref 4.0–10.5)
nRBC: 0 % (ref 0.0–0.2)

## 2021-02-09 LAB — COMPREHENSIVE METABOLIC PANEL
ALT: 49 U/L — ABNORMAL HIGH (ref 0–44)
AST: 33 U/L (ref 15–41)
Albumin: 3.4 g/dL — ABNORMAL LOW (ref 3.5–5.0)
Alkaline Phosphatase: 76 U/L (ref 38–126)
Anion gap: 13 (ref 5–15)
BUN: 10 mg/dL (ref 6–20)
CO2: 20 mmol/L — ABNORMAL LOW (ref 22–32)
Calcium: 9.6 mg/dL (ref 8.9–10.3)
Chloride: 97 mmol/L — ABNORMAL LOW (ref 98–111)
Creatinine, Ser: 0.77 mg/dL (ref 0.44–1.00)
GFR, Estimated: >60 mL/min (ref >60)
Glucose, Bld: 231 mg/dL — ABNORMAL HIGH (ref 70–99)
Potassium: 4.1 mmol/L (ref 3.5–5.1)
Sodium: 130 mmol/L — ABNORMAL LOW (ref 135–145)
Total Bilirubin: 0.9 mg/dL (ref 0.3–1.2)
Total Protein: 7.7 g/dL (ref 6.5–8.1)

## 2021-02-09 LAB — POCT URINALYSIS DIPSTICK, ED / UC
Bilirubin Urine: NEGATIVE
Glucose, UA: >=1000 mg/dL — AB
Hgb urine dipstick: NEGATIVE
Ketones, ur: 80 mg/dL — AB
Leukocytes,Ua: NEGATIVE
Nitrite: NEGATIVE
Protein, ur: 30 mg/dL — AB
Specific Gravity, Urine: 1.01 (ref 1.005–1.030)
Urobilinogen, UA: 0.2 mg/dL (ref 0.0–1.0)
pH: 5.5 (ref 5.0–8.0)

## 2021-02-09 LAB — POC INFLUENZA A AND B ANTIGEN (URGENT CARE ONLY)
INFLUENZA A ANTIGEN, POC: NEGATIVE
INFLUENZA B ANTIGEN, POC: NEGATIVE

## 2021-02-09 LAB — CBG MONITORING, ED: Glucose-Capillary: 253 mg/dL — ABNORMAL HIGH (ref 70–99)

## 2021-02-09 LAB — SARS CORONAVIRUS 2 (TAT 6-24 HRS): SARS Coronavirus 2: NEGATIVE

## 2021-02-09 MED ORDER — KETOROLAC TROMETHAMINE 30 MG/ML IJ SOLN
INTRAMUSCULAR | Status: AC
  Filled 2021-02-09: qty 1

## 2021-02-09 MED ORDER — KETOROLAC TROMETHAMINE 30 MG/ML IJ SOLN
15.0000 mg | Freq: Once | INTRAMUSCULAR | Status: AC
  Administered 2021-02-09: 15 mg via INTRAVENOUS

## 2021-02-09 MED ORDER — SODIUM CHLORIDE 0.9 % IV BOLUS
1000.0000 mL | Freq: Once | INTRAVENOUS | Status: AC
  Administered 2021-02-09: 1000 mL via INTRAVENOUS

## 2021-02-09 NOTE — ED Triage Notes (Signed)
Pt presents with all over body pain xs 3-4 days. States took advil with no relief last night.

## 2021-02-09 NOTE — Discharge Instructions (Addendum)
Your labs do indicate that you are apparently fighting infectious illness, which is likely the source of your body aches and fevers.  I do not see an obvious source of infection to treat, however.  I would like you to increase your water intake and restart your diabetes medications to help manage your blood sugar.  You can use tylenol and ibuprofen as needed for body aches and fevers.  Return in two days if no improvement in symptoms or any new symptoms, for recheck and further evaluation.  If worsening please go to the Emergency Department.  We will call you if your Covid test is positive.

## 2021-02-09 NOTE — ED Provider Notes (Signed)
[facility]    CSN: [id number] Arrival date & time: 02/09/21  1118      History   Chief Complaint Chief Complaint  Patient presents with  . Generalized Body Aches    HPI [name] is a 40 y.o. female.   [name] presents with complaints of body aches which started around 3 days ago. She feels back pain and extremity pain. Very mild headache. No URI symptoms. No vomiting or diarrhea. She has had significantly decreased appetite, stating she feels full very quickly so has had very little intake. She has not taken her medications in the past two days days, but prior to this had been taking. No urinary symptoms. No chest pain . No known ill contacts.    ROS per HPI, negative if not otherwise mentioned.      Past Medical History:  Diagnosis Date  . Diabetes mellitus without complication ([facility])    on Metformin    Patient Active Problem List   Diagnosis Date Noted  . 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 Diabetes ([facility]) 08/07/2013  . 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 ESOPHAGOGASTRODUODENOSCOPY N/A 08/12/2019   Procedure: ESOPHAGOGASTRODUODENOSCOPY (EGD);  Surgeon: Charna Elizabeth, MD;  Location: [facility] ENDOSCOPY;  Service: Endoscopy;  Laterality: N/A;  . FOREIGN BODY REMOVAL N/A 08/12/2019   Procedure: FOREIGN BODY REMOVAL;  Surgeon: Charna Elizabeth, MD;  Location: [facility] ENDOSCOPY;  Service: Endoscopy;  Laterality: N/A;  . LAPAROSCOPIC BILATERAL SALPINGECTOMY Bilateral 07/19/2014   Procedure: LAPAROSCOPIC BILATERAL SALPINGECTOMY;  Surgeon: Catalina Antigua, MD;  Location: [facility];  Service: Gynecology;  Laterality: Bilateral;    OB History    Gravida  2   Para  2   Term  2   Preterm      AB      Living  2     SAB      IAB      Ectopic      Multiple      Live Births               Home Medications    Prior to Admission medications   Medication Sig Start Date End Date Taking?  Authorizing Provider  dapagliflozin propanediol (FARXIGA) 10 MG TABS tablet Take 10 mg by mouth daily.    [provider]  metFORMIN (GLUCOPHAGE) 500 MG tablet Take 2 tablets (1,000 mg total) by mouth 2 (two) times daily with a meal. 03/10/20   Burns, Bobette Mo, MD  Semaglutide (RYBELSUS) 3 MG TABS Take 3 mg by mouth daily. 30 minutes before breakfast with 4 oz water    [provider]    Family History Family History  Problem Relation Age of Onset  . Diabetes Mother     Social History Social History   Tobacco Use  . Smoking status: Never Smoker  . Smokeless tobacco: Never Used  Substance Use Topics  . Alcohol use: No  . Drug use: No     Allergies   Patient has no known allergies.   Review of Systems Review of Systems   Physical Exam Triage Vital Signs ED Triage Vitals  Enc Vitals Group     BP 02/09/21 1134 (!) 139/94     Pulse Rate 02/09/21 1134 (!) 120     Resp 02/09/21 1134 16     Temp 02/09/21 1134 99.3 F (37.4 C)     Temp Source 02/09/21 1134 Oral  SpO2 02/09/21 1134 99 %     Weight --      Height --      Head Circumference --      Peak Flow --      Pain Score 02/09/21 1132 8     Pain Loc --      Pain Edu? --      Excl. in GC? --    No data found.  Updated Vital Signs BP (!) 143/82   Pulse (!) 116   Temp 99.3 F (37.4 C) (Oral)   Resp 16   SpO2 99%   Visual Acuity Right Eye Distance:   Left Eye Distance:   Bilateral Distance:    Right Eye Near:   Left Eye Near:    Bilateral Near:     Physical Exam Constitutional:      Appearance: She is obese.  HENT:     Head: Normocephalic and atraumatic.     Right Ear: A middle ear effusion is present.     Left Ear: A middle ear effusion is present.     Nose: Nose normal.     Mouth/Throat:     Mouth: Mucous membranes are moist.  Eyes:     Pupils: Pupils are equal, round, and reactive to light.  Cardiovascular:     Rate and Rhythm: Tachycardia present.  Abdominal:      Tenderness: There is abdominal tenderness in the left lower quadrant.  Genitourinary:    Comments: Denies any vaginal complaints  Musculoskeletal:        General: Normal range of motion.     Cervical back: Normal range of motion.  Skin:    General: Skin is warm and dry.  Neurological:     General: No focal deficit present.     Mental Status: She is alert.  Psychiatric:        Mood and Affect: Mood normal.      UC Treatments / Results  Labs (all labs ordered are listed, but only abnormal results are displayed) Labs Reviewed  CBC WITH DIFFERENTIAL/PLATELET - Abnormal; Notable for the following components:      Result Value   WBC 18.3 (*)    MCV 78.6 (*)    MCH 25.9 (*)    Neutro Abs 15.0 (*)    All other components within normal limits  COMPREHENSIVE METABOLIC PANEL - Abnormal; Notable for the following components:   Sodium 130 (*)    Chloride 97 (*)    CO2 20 (*)    Glucose, Bld 231 (*)    Albumin 3.4 (*)    ALT 49 (*)    All other components within normal limits  POCT URINALYSIS DIPSTICK, ED / UC - Abnormal; Notable for the following components:   Glucose, UA >=1000 (*)    Ketones, ur 80 (*)    Protein, ur 30 (*)    All other components within normal limits  CBG MONITORING, ED - Abnormal; Notable for the following components:   Glucose-Capillary 253 (*)    All other components within normal limits  SARS CORONAVIRUS 2 (TAT 6-24 HRS)  POC INFLUENZA A AND B ANTIGEN (URGENT CARE ONLY)    EKG   Radiology No results found.  Procedures Procedures (including critical care time)  Medications Ordered in UC Medications  sodium chloride 0.9 % bolus 1,000 mL (1,000 mLs Intravenous New Bag/Given 02/09/21 1229)  ketorolac (TORADOL) 30 MG/ML injection 15 mg (15 mg Intravenous Given 02/09/21 1339)    Initial Impression /  Assessment and Plan / UC Course  I have reviewed the triage vital signs and the nursing notes.  Pertinent labs & imaging results that were available  during my care of the patient were reviewed by me and considered in my medical decision making (see chart for details).     Tachycardia with minimal improvement with ivf bolus here in clinic. Generalized body aches, no meningeal signs. Overall with non specific exam- very mild LLQ abdominal pain, bilateral TM's with mild effusions and redness but without pain, mild headache. No urinary tract infection, no URI symptoms. WBC is elevated today, consistent with body aches and tachycardia. Negative influenza. Viral illness still considered- close watch at home over the next 24-48 with strict return precautions and recheck recommended in the next two days. ER precautions discussed as well. Covid testing pending and isolation instructions provided.  Patient verbalized understanding and agreeable to plan.  Ambulatory out of clinic without difficulty.    Final Clinical Impressions(s) / UC Diagnoses   Final diagnoses:  Body aches  Tachycardia     Discharge Instructions     Your labs do indicate that you are apparently fighting infectious illness, which is likely the source of your body aches and fevers.  I do not see an obvious source of infection to treat, however.  I would like you to increase your water intake and restart your diabetes medications to help manage your blood sugar.  You can use tylenol and ibuprofen as needed for body aches and fevers.  Return in two days if no improvement in symptoms or any new symptoms, for recheck and further evaluation.  If worsening please go to the Emergency Department.  We will call you if your Covid test is positive.     ED Prescriptions    None     PDMP not reviewed this encounter.   Georgetta Haber, NP 02/09/21 2030

## 2021-03-02 ENCOUNTER — Ambulatory Visit: Payer: 59 | Admitting: Internal Medicine

## 2021-03-06 ENCOUNTER — Ambulatory Visit: Payer: Medicaid Other | Admitting: Internal Medicine

## 2021-03-08 DIAGNOSIS — D72829 Elevated white blood cell count, unspecified: Secondary | ICD-10-CM | POA: Insufficient documentation

## 2021-03-08 NOTE — Progress Notes (Signed)
Subjective:    Patient ID: [name], female    DOB: Apr 14, 1981, 40 y.o.   MRN: [id number]  HPI The patient is here for follow up of their chronic medical problems, including DM.  Her son is here with her and translates.    After taking the metformin she felt  dizziness/lightheaded.  It starts w/in 30 minutes and can last up to 2 hrs.  This stopped when she stopped the rybelsus.   The only carbs she eats is rice.  She has poison ivy on her arms and neck. She has not tried anything for this.  Medications and allergies reviewed with patient and updated if appropriate.  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     metFORMIN (GLUCOPHAGE) 500 MG tablet Take 2 tablets (1,000 mg total) by mouth 2 (two) times daily with a meal. 360 tablet 1   Semaglutide (RYBELSUS) 3 MG TABS Take 3 mg by mouth daily. 30 minutes before breakfast with 4 oz water     No current facility-administered medications on file prior to visit.    Past Medical History:  Diagnosis Date   Diabetes mellitus without complication (HCC)    on Metformin    Past Surgical History:  Procedure Laterality Date   ESOPHAGOGASTRODUODENOSCOPY N/A 08/12/2019   Procedure: ESOPHAGOGASTRODUODENOSCOPY (EGD);  Surgeon: Charna Elizabeth, MD;  Location: [facility] ENDOSCOPY;  Service: Endoscopy;  Laterality: N/A;   FOREIGN BODY REMOVAL N/A 08/12/2019   Procedure: FOREIGN BODY REMOVAL;  Surgeon: Charna Elizabeth, MD;  Location: [facility] ENDOSCOPY;  Service: Endoscopy;  Laterality: N/A;   LAPAROSCOPIC BILATERAL SALPINGECTOMY Bilateral 07/19/2014   Procedure: LAPAROSCOPIC BILATERAL SALPINGECTOMY;  Surgeon: Catalina Antigua, MD;   Location: [facility] ORS;  Service: Gynecology;  Laterality: Bilateral;    Social History   Socioeconomic History   Marital status: Single    Spouse name: Not on file   Number of children: Not on file   Years of education: Not on file   Highest education level: Not on file  Occupational History   Not on file  Tobacco Use   Smoking status: Never   Smokeless tobacco: Never  Substance and Sexual Activity   Alcohol use: No   Drug use: No   Sexual activity: Not Currently    Birth control/protection: Surgical  Other Topics Concern   Not on file  Social History Narrative   Not on file   Social Determinants of Health   Financial Resource Strain: Not on file  Food Insecurity: Not on file  Transportation Needs: Not on file  Physical Activity: Not on file  Stress: Not on file  Social Connections: Not on file    Family History  Problem Relation Age of Onset   Diabetes Mother     Review of Systems  Constitutional:  Negative for fever.  Eyes:  Negative for visual disturbance.  Respiratory:  Negative for shortness of breath.   Cardiovascular:  Negative for chest pain, palpitations and leg swelling.  Endocrine: Negative for polydipsia and polyuria.  Neurological:  Positive for dizziness and light-headedness. Negative for numbness and headaches.      Objective:   Vitals:   03/09/21 1429  BP: 126/80  Pulse: 98  Temp: 98.5 F (36.9 C)  SpO2: 98%   BP Readings from Last 3 Encounters:  03/09/21 126/80  02/09/21 (!) 143/82  09/01/20 118/74   Wt Readings from Last 3 Encounters:  03/09/21 129 lb (58.5 kg)  09/01/20 138 lb 6.4 oz (62.8 kg)  03/10/20 141 lb (64 kg)   Body mass index is 23.59 kg/m.   Physical Exam    Constitutional: Appears well-developed and well-nourished. No distress.  HENT:  Head: Normocephalic and atraumatic.  Neck: Neck supple. No tracheal deviation present. No thyromegaly present.  No cervical lymphadenopathy Cardiovascular: Normal rate, regular  rhythm and normal heart sounds.   No murmur heard. No carotid bruit .  No edema Pulmonary/Chest: Effort normal and breath sounds normal. No respiratory distress. No has no wheezes. No rales.  Skin: Skin is warm and dry. Not diaphoretic.  Psychiatric: Normal mood and affect. Behavior is normal.      Assessment & Plan:    See Problem List for Assessment and Plan of chronic medical problems.    This visit occurred during the SARS-CoV-2 public health emergency.  Safety protocols were in place, including screening questions prior to the visit, additional usage of staff PPE, and extensive cleaning of exam room while observing appropriate contact time as indicated for disinfecting solutions.

## 2021-03-08 NOTE — Patient Instructions (Addendum)
  Blood work was ordered.     Medications changes include :   cream for poison ivy  Your prescription(s) have been submitted to your pharmacy. Please take as directed and contact our office if you believe you are having problem(s) with the medication(s).   Please followup in 6 months

## 2021-03-09 ENCOUNTER — Ambulatory Visit (INDEPENDENT_AMBULATORY_CARE_PROVIDER_SITE_OTHER): Payer: Self-pay | Admitting: Internal Medicine

## 2021-03-09 ENCOUNTER — Telehealth: Payer: Self-pay | Admitting: Internal Medicine

## 2021-03-09 ENCOUNTER — Encounter: Payer: Self-pay | Admitting: Internal Medicine

## 2021-03-09 ENCOUNTER — Other Ambulatory Visit: Payer: Self-pay

## 2021-03-09 VITALS — BP 126/80 | HR 98 | Temp 98.5°F | Ht 62.0 in | Wt 129.0 lb

## 2021-03-09 DIAGNOSIS — E11319 Type 2 diabetes mellitus with unspecified diabetic retinopathy without macular edema: Secondary | ICD-10-CM

## 2021-03-09 DIAGNOSIS — L237 Allergic contact dermatitis due to plants, except food: Secondary | ICD-10-CM

## 2021-03-09 DIAGNOSIS — D72829 Elevated white blood cell count, unspecified: Secondary | ICD-10-CM

## 2021-03-09 LAB — BASIC METABOLIC PANEL
BUN: 10 mg/dL (ref 6–23)
CO2: 25 mEq/L (ref 19–32)
Calcium: 9.8 mg/dL (ref 8.4–10.5)
Chloride: 103 mEq/L (ref 96–112)
Creatinine, Ser: 0.61 mg/dL (ref 0.40–1.20)
GFR: 111.61 mL/min (ref >60.00)
Glucose, Bld: 247 mg/dL — ABNORMAL HIGH (ref 70–99)
Potassium: 3.5 mEq/L (ref 3.5–5.1)
Sodium: 135 mEq/L (ref 135–145)

## 2021-03-09 LAB — HEMOGLOBIN A1C: Hgb A1c MFr Bld: 11.1 % — ABNORMAL HIGH (ref 4.6–6.5)

## 2021-03-09 MED ORDER — TRIAMCINOLONE ACETONIDE 0.1 % EX CREA: 1.0000 "application " | TOPICAL_CREAM | Freq: Two times a day (BID) | CUTANEOUS | 0 refills | Status: DC

## 2021-03-09 MED ORDER — BLOOD GLUCOSE MONITOR KIT: PACK | 0 refills | Status: DC

## 2021-03-09 NOTE — Telephone Encounter (Signed)
Patients sister Hme (May)calling to report Metformin causes patient to be dizzy. She does not want to disctinue Rybelsus   Please call

## 2021-03-10 DIAGNOSIS — L237 Allergic contact dermatitis due to plants, except food: Secondary | ICD-10-CM | POA: Insufficient documentation

## 2021-03-10 NOTE — Assessment & Plan Note (Addendum)
Chronic Not controlled Did not tolerate rybelsus Continue metformin 2000 mg bid - the side effects she thinks was from the metformin was likely from the rybelsus Continue farxiga 10 mg daily A1c, bmp today - will add medication if needed Will need pt assistance for farxiga since she no longer has insurance Advised decreasing rice intake

## 2021-03-10 NOTE — Assessment & Plan Note (Signed)
Acute Has poison ivy on arms, neck Triamcinolone 0.1% cream bid

## 2021-03-13 NOTE — Telephone Encounter (Signed)
When she was here she stated the dizziness was no longer present and it stopped when she stopped the rybelsus.    If she is still having dizziness then we can consider stopping the metformin but I do not advise that - she has been on the medication for long time and has tolerated it well.    Her sugars are very high and at this point we need to start insulin.  She could continue the metformin with the insulin because it is a cheap medication that works well.   Rybelsus is not affordable for her w/o insurance - it will cost her $880 a month.  The farxiga she is taking is also nt going to be affordable.   I am looking into patient assistance for her medications, but that may take a while.    We need to start her on insulin injections daily and I would like her to see an endocrinologist/ diabetes specialist - I have ordered a referral.    She needs to significantly cut down on her rice intake.    Is she ok with starting insulin?

## 2021-03-14 MED ORDER — METFORMIN HCL 500 MG PO TABS: 500.0000 mg | ORAL_TABLET | Freq: Every day | ORAL | 1 refills | Status: DC

## 2021-03-14 MED ORDER — RYBELSUS 3 MG PO TABS: 3.0000 mg | ORAL_TABLET | Freq: Every day | ORAL | 0 refills | Status: DC

## 2021-03-14 MED ORDER — INSULIN PEN NEEDLE 30G X 8 MM MISC: 1.0000 | 5 refills | Status: DC | PRN

## 2021-03-14 MED ORDER — INSULIN GLARGINE 100 UNIT/ML ~~LOC~~ SOLN: 15.0000 [IU] | Freq: Every day | SUBCUTANEOUS | 1 refills | Status: DC

## 2021-03-14 NOTE — Telephone Encounter (Signed)
Discussed PCP response.   Sister states pt is able to tolerate Metformin 500mg  once a day only; is agreeable to adding insulin & endo referral.  She states pt will continue taking until told otherwise. Reviewed lab values with sister.

## 2021-03-14 NOTE — Telephone Encounter (Signed)
Insulin glargine sent to [facility] - see how much it is with good rx.

## 2021-03-14 NOTE — Addendum Note (Signed)
Addended by: Pincus Sanes on: 03/14/2021 08:24 PM   Modules accepted: Orders

## 2021-03-16 NOTE — Telephone Encounter (Signed)
Sister notified of PCP response & verb understanding.  Denies further ques/concerns.

## 2021-03-16 NOTE — Telephone Encounter (Signed)
Notified May (sister) re: addition of insulin to medication list.  Instructions on medication given. Instructed to notify RN if good rx card needed (RN tried to call pharmacy to determine out of pocket cost to pt however, long hold, may be quicker if pt inquired in person).   Clarification needed on meds to take: Metformin 500mg  daily, insulin daily, Farxiga 10mg  daily (or discontinue farxiga?).

## 2021-03-16 NOTE — Telephone Encounter (Signed)
She will continue the metformin 500 mg daily.  Farxiga 10 mg daily and the insulin.

## 2021-03-22 ENCOUNTER — Telehealth: Payer: Self-pay | Admitting: Pharmacist

## 2021-03-22 NOTE — Telephone Encounter (Signed)
Patient has no insurance and reports Lantus is cost prohibitive at this time.  Reviewed application process for  [employer]  patient assistance program. Patient meets income/out of pocket spend criteria for the program. Patient will provide proof of income, out of pocket spend report, and will sign application. Will collaborate with prescriber Dr Lawerance Bach for the provider portion of application. Once completed, application will be submitted via Fax   Patient assistance program Fax number: [phone]  Emailed patient's sister Hme ([email or web address]) the patient section for them to complete and email/fax back.  Kathyrn Sheriff, [facility]

## 2021-06-08 ENCOUNTER — Ambulatory Visit (INDEPENDENT_AMBULATORY_CARE_PROVIDER_SITE_OTHER): Payer: Medicaid Other | Admitting: Endocrinology

## 2021-06-08 ENCOUNTER — Other Ambulatory Visit: Payer: Self-pay

## 2021-06-08 VITALS — BP 144/90 | HR 105 | Ht 62.0 in | Wt 125.0 lb

## 2021-06-08 DIAGNOSIS — E11319 Type 2 diabetes mellitus with unspecified diabetic retinopathy without macular edema: Secondary | ICD-10-CM

## 2021-06-08 LAB — POCT GLYCOSYLATED HEMOGLOBIN (HGB A1C): Hemoglobin A1C: 12.5 % — AB (ref 4.0–5.6)

## 2021-06-08 MED ORDER — INSULIN NPH (HUMAN) (ISOPHANE) 100 UNIT/ML ~~LOC~~ SUSP: 15.0000 [IU] | SUBCUTANEOUS | 11 refills | Status: DC

## 2021-06-08 NOTE — Patient Instructions (Addendum)
good diet and exercise significantly improve the control of your diabetes.  please let me know if you wish to be referred to a dietician.  high blood sugar is very risky to your health.  you should see an eye doctor and dentist every year.  It is very important to get all recommended vaccinations.  Controlling your blood pressure and cholesterol drastically reduces the damage diabetes does to your body.  Those who smoke should quit.  Please discuss these with your doctor.  check your blood sugar every other day.  vary the time of day when you check, between before the 3 meals, and at bedtime.  also check if you have symptoms of your blood sugar being too high or too low.  please keep a record of the readings and bring it to your next appointment here (or you can bring the meter itself).  You can write it on any piece of paper.  please call us sooner if your blood sugar goes below 70, or if most of your readings are over 200.  I have sent a prescription to your pharmacy, to start 15 units each morning. On this type of insulin schedule, you should eat meals on a regular schedule.  If a meal is missed or significantly delayed, your blood sugar could go low.  Please call or message [place] next week, to tell us how the blood sugar is doing.  Please come back for a follow-up appointment in 2 months.    ch? ?? ?n u?ng t?t v t?p th? d?c c?i thi?n ?ng k? vi?c ki?m sot b?nh ti?u ???ng c?a b?n. xin vui lng cho ti bi?t n?u b?n mu?n ???c gi?i thi?u ??n m?t chuyn gia dinh d??ng. l??ng ???ng trong mu cao c r?t nhi?u nguy c? ??i v?i s?c kh?e c?a b?n. b?n nn ?i khm bc s? nhn khoa v nha s? hng n?m. ?i?u r?t quan tr?ng l ph?i tim phng t?t c? cc lo?i v?c xin ???c khuy?n ngh?. Ki?m sot huy?t p v cholesterol lm gi?m ?ng k? tc h?i c?a b?nh ti?u ???ng ??i v?i c? th? b?n. Nh?ng ng??i ht thu?c nn b? thu?c l. Vui lng th?o lu?n nh?ng ?i?u ny v?i bc s? c?a b?n. ki?m tra l??ng ???ng trong mu c?a b?n m?i  ngy. thay ??i th?i gian trong ngy khi b?n ki?m tra, gi?a tr??c 3 b?a ?n v tr??c khi ?i ng?. c?ng ki?m tra xem b?n c cc tri?u ch?ng v? l??ng ???ng trong mu c?a b?n qu cao ho?c qu th?p. vui lng ghi l?i cc k?t qu? ?o v mang n ??n cu?c h?n ti?p theo c?a b?n t?i ?y (ho?c b?n c th? t? mang theo my ?o). B?n c th? vi?t n trn b?t k? m?nh gi?y no. Vui lng g?i cho chng ti s?m h?n n?u l??ng ???ng trong mu c?a b?n xu?ng d??i 70, ho?c n?u h?u h?t cc k?t qu? ?o c?a b?n trn 200. Ti ? g?i m?t ??n thu?c ??n hi?u thu?c c?a b?n, ?? b?t ??u 15 ??n v? m?i sng. ??i v?i lo?i l?ch trnh insulin ny, b?n nn ?n cc b?a ?n theo l?ch trnh ??u ??n. N?u b?a ?n b? b? l? ho?c b? tr hon ?ng k?, l??ng ???ng trong mu c?a b?n c th? xu?ng th?p. Vui lng g?i ho?c nh?n tin cho chng ti vo tu?n t?i ?? cho chng ti bi?t tnh hnh ???ng huy?t. Vui lng ??n h?n ti khm sau 2 thng.

## 2021-06-08 NOTE — Progress Notes (Signed)
Subjective:    Patient ID: [name], female    DOB: 09/25/1980, 40 y.o.   MRN: [id number]  HPI pt is referred by Dr Lawerance Bach, for diabetes.  Pt states DM was dx'ed in 2006; she is unaware of any chronic complications; she has never been on insulin; pt says her diet and exercise are good; she has never had GDM, pancreatitis, pancreatic surgery, severe hypoglycemia or DKA.  She has no health insurance.  She does not take the Lantus that was rx'ed.   Past Medical History:  Diagnosis Date   Diabetes mellitus without complication (HCC)    on Metformin    Past Surgical History:  Procedure Laterality Date   ESOPHAGOGASTRODUODENOSCOPY N/A 08/12/2019   Procedure: ESOPHAGOGASTRODUODENOSCOPY (EGD);  Surgeon: Charna Elizabeth, MD;  Location: [facility] ENDOSCOPY;  Service: Endoscopy;  Laterality: N/A;   FOREIGN BODY REMOVAL N/A 08/12/2019   Procedure: FOREIGN BODY REMOVAL;  Surgeon: Charna Elizabeth, MD;  Location: [facility] ENDOSCOPY;  Service: Endoscopy;  Laterality: N/A;   LAPAROSCOPIC BILATERAL SALPINGECTOMY Bilateral 07/19/2014   Procedure: LAPAROSCOPIC BILATERAL SALPINGECTOMY;  Surgeon: Catalina Antigua, MD;  Location: [facility] ORS;  Service: Gynecology;  Laterality: Bilateral;    Social History   Socioeconomic History   Marital status: Single    Spouse name: Not on file   Number of children: Not on file   Years of education: Not on file   Highest education level: Not on file  Occupational History   Not on file  Tobacco Use   Smoking status: Never   Smokeless tobacco: Never  Substance and Sexual Activity   Alcohol use: No   Drug use: No   Sexual activity: Not Currently    Birth control/protection: Surgical  Other Topics Concern   Not on file  Social History Narrative   Not on file   Social Determinants of Health   Financial Resource Strain: Not on file  Food Insecurity: Not on file  Transportation Needs: Not on file  Physical Activity: Not on file  Stress: Not on file  Social Connections: Not on file   Intimate Partner Violence: Not on file    Current Outpatient Medications on File Prior to Visit  Medication Sig Dispense Refill   metFORMIN (GLUCOPHAGE) 500 MG tablet Take 1 tablet (500 mg total) by mouth daily with breakfast. 90 tablet 1   triamcinolone cream (KENALOG) 0.1 % Apply 1 application topically 2 (two) times daily. 15 g 0   No current facility-administered medications on file prior to visit.    Allergies  Allergen Reactions   Semaglutide     Rybelsus - dizziness    Family History  Problem Relation Age of Onset   Diabetes Mother     BP (!) 144/90 (BP Location: Right Arm, Patient Position: Sitting, Cuff Size: Normal)   Pulse (!) 105   Ht 5\' 2"  (1.575 m)   Wt 125 lb (56.7 kg)   SpO2 97%   BMI 22.86 kg/m   Review of Systems denies sob, n/v, and depression.  She has lost 15 lbs x a few mos--pt says intentional.      Objective:   Physical Exam Pulses: dorsalis pedis intact bilat.   MSK: no deformity of the feet CV: no leg edema Skin:  no ulcer on the feet.  normal color and temp on the feet.  Neuro: sensation is intact to touch on the feet.    Lab Results  Component Value Date   HGBA1C 12.5 (A) 06/08/2021   Lab Results  Component  Value Date   CREATININE 0.61 03/09/2021   BUN 10 03/09/2021   NA 135 03/09/2021   K 3.5 03/09/2021   CL 103 03/09/2021   CO2 25 03/09/2021      Assessment & Plan:  Type 2 DM: severe exacerbation.  I told pt she needs insulin, especially in view of need to minimize med costs.  Patient Instructions  good diet and exercise significantly improve the control of your diabetes.  please let me know if you wish to be referred to a dietician.  high blood sugar is very risky to your health.  you should see an eye doctor and dentist every year.  It is very important to get all recommended vaccinations.  Controlling your blood pressure and cholesterol drastically reduces the damage diabetes does to your body.  Those who smoke should  quit.  Please discuss these with your doctor.  check your blood sugar every other day.  vary the time of day when you check, between before the 3 meals, and at bedtime.  also check if you have symptoms of your blood sugar being too high or too low.  please keep a record of the readings and bring it to your next appointment here (or you can bring the meter itself).  You can write it on any piece of paper.  please call us sooner if your blood sugar goes below 70, or if most of your readings are over 200.  I have sent a prescription to your pharmacy, to start 15 units each morning. On this type of insulin schedule, you should eat meals on a regular schedule.  If a meal is missed or significantly delayed, your blood sugar could go low.  Please call or message [place] next week, to tell us how the blood sugar is doing.  Please come back for a follow-up appointment in 2 months.    ch? ?? ?n u?ng t?t v t?p th? d?c c?i thi?n ?ng k? vi?c ki?m sot b?nh ti?u ???ng c?a b?n. xin vui lng cho ti bi?t n?u b?n mu?n ???c gi?i thi?u ??n m?t chuyn gia dinh d??ng. l??ng ???ng trong mu cao c r?t nhi?u nguy c? ??i v?i s?c kh?e c?a b?n. b?n nn ?i khm bc s? nhn khoa v nha s? hng n?m. ?i?u r?t quan tr?ng l ph?i tim phng t?t c? cc lo?i v?c xin ???c khuy?n ngh?. Ki?m sot huy?t p v cholesterol lm gi?m ?ng k? tc h?i c?a b?nh ti?u ???ng ??i v?i c? th? b?n. Nh?ng ng??i ht thu?c nn b? thu?c l. Vui lng th?o lu?n nh?ng ?i?u ny v?i bc s? c?a b?n. ki?m tra l??ng ???ng trong mu c?a b?n m?i ngy. thay ??i th?i gian trong ngy khi b?n ki?m tra, gi?a tr??c 3 b?a ?n v tr??c khi ?i ng?. c?ng ki?m tra xem b?n c cc tri?u ch?ng v? l??ng ???ng trong mu c?a b?n qu cao ho?c qu th?p. vui lng ghi l?i cc k?t qu? ?o v mang n ??n cu?c h?n ti?p theo c?a b?n t?i ?y (ho?c b?n c th? t? mang theo my ?o). B?n c th? vi?t n trn b?t k? m?nh gi?y no. Vui lng g?i cho chng ti s?m h?n n?u l??ng ???ng trong mu c?a b?n xu?ng  d??i 70, ho?c n?u h?u h?t cc k?t qu? ?o c?a b?n trn 200. Ti ? g?i m?t ??n thu?c ??n hi?u thu?c c?a b?n, ?? b?t ??u 15 ??n v? m?i sng. ??i v?i lo?i l?ch trnh insulin ny, b?n nn ?n  cc b?a ?n theo l?ch trnh ??u ??n. N?u b?a ?n b? b? l? ho?c b? tr hon ?ng k?, l??ng ???ng trong mu c?a b?n c th? xu?ng th?p. Vui lng g?i ho?c nh?n tin cho chng ti vo tu?n t?i ?? cho chng ti bi?t tnh hnh ???ng huy?t. Vui lng ??n h?n ti khm sau 2 thng.

## 2021-07-18 ENCOUNTER — Other Ambulatory Visit: Payer: Self-pay | Admitting: Internal Medicine

## 2021-08-17 ENCOUNTER — Ambulatory Visit: Payer: Medicaid Other | Admitting: Endocrinology

## 2021-09-20 ENCOUNTER — Encounter: Payer: Self-pay | Admitting: Internal Medicine

## 2021-09-20 NOTE — Progress Notes (Signed)
Subjective:    Patient ID: [name], female    DOB: 1980-09-28, 41 y.o.   MRN: [id number]  This visit occurred during the SARS-CoV-2 public health emergency.  Safety protocols were in place, including screening questions prior to the visit, additional usage of staff PPE, and extensive cleaning of exam room while observing appropriate contact time as indicated for disinfecting solutions.     HPI The patient is here for follow up of their chronic medical problems, including DM.  Her son is here today and he translates.  She is only taking metformin 500 mg bid-higher doses cause loose stools and dizziness.  She is taking the farxiga 10 mg daily.  She is not taking insulin-this was very expensive.   Medications and allergies reviewed with patient and updated if appropriate.  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 15 g 0   No current facility-administered medications on file prior to visit.    Past Medical History:  Diagnosis Date   Diabetes mellitus without complication (HCC)    on Metformin    Past Surgical History:  Procedure Laterality Date   ESOPHAGOGASTRODUODENOSCOPY N/A 08/12/2019   Procedure: ESOPHAGOGASTRODUODENOSCOPY (EGD);  Surgeon: Charna Elizabeth, MD;  Location: [facility] ENDOSCOPY;  Service: Endoscopy;  Laterality: N/A;   FOREIGN BODY REMOVAL N/A 08/12/2019   Procedure: FOREIGN BODY REMOVAL;  Surgeon: Charna Elizabeth, MD;  Location: [facility] ENDOSCOPY;  Service: Endoscopy;  Laterality: N/A;    LAPAROSCOPIC BILATERAL SALPINGECTOMY Bilateral 07/19/2014   Procedure: LAPAROSCOPIC BILATERAL SALPINGECTOMY;  Surgeon: Catalina Antigua, MD;  Location: [facility] ORS;  Service: Gynecology;  Laterality: Bilateral;    Social History   Socioeconomic History   Marital status: Single    Spouse name: Not on file   Number of children: Not on file   Years of education: Not on file   Highest education level: Not on file  Occupational History   Not on file  Tobacco Use   Smoking status: Never   Smokeless tobacco: Never  Substance and Sexual Activity   Alcohol use: No   Drug use: No   Sexual activity: Not Currently    Birth control/protection: Surgical  Other Topics Concern   Not on file  Social History Narrative   Not on file   Social Determinants of Health   Financial Resource Strain: Not on file  Food Insecurity: Not on file  Transportation Needs: Not on file  Physical Activity: Not on file  Stress: Not on file  Social Connections: Not on file    Family History  Problem Relation Age of Onset   Diabetes Mother     Review of Systems  Constitutional:  Negative for fever.  Respiratory:  Negative for cough, shortness of breath and wheezing.   Cardiovascular:  Negative for chest pain, palpitations and leg swelling.  Neurological:  Negative for dizziness, light-headedness, numbness and headaches.      Objective:   Vitals:   09/21/21 1336  BP: 120/82  Pulse: 98  Temp: 98.4 F (  36.9 C)  SpO2: 98%   BP Readings from Last 3 Encounters:  09/21/21 120/82  06/08/21 (!) 144/90  03/09/21 126/80   Wt Readings from Last 3 Encounters:  09/21/21 123 lb (55.8 kg)  06/08/21 125 lb (56.7 kg)  03/09/21 129 lb (58.5 kg)   Body mass index is 22.5 kg/m.   Physical Exam    Constitutional: Appears well-developed and well-nourished. No distress.  HENT:  Head: Normocephalic and atraumatic.  Neck: Neck supple. No tracheal deviation present. No thyromegaly present.  No cervical  lymphadenopathy Cardiovascular: Normal rate, regular rhythm and normal heart sounds.   No murmur heard. No carotid bruit .  No edema Pulmonary/Chest: Effort normal and breath sounds normal. No respiratory distress. No has no wheezes. No rales.  Skin: Skin is warm and dry. Not diaphoretic.  Psychiatric: Normal mood and affect. Behavior is normal.      Assessment & Plan:    See Problem List for Assessment and Plan of chronic medical problems.

## 2021-09-21 ENCOUNTER — Ambulatory Visit (INDEPENDENT_AMBULATORY_CARE_PROVIDER_SITE_OTHER): Payer: Self-pay | Admitting: Internal Medicine

## 2021-09-21 ENCOUNTER — Other Ambulatory Visit: Payer: Self-pay

## 2021-09-21 ENCOUNTER — Encounter: Payer: Self-pay | Admitting: Internal Medicine

## 2021-09-21 VITALS — BP 120/82 | HR 98 | Temp 98.4°F | Ht 62.0 in | Wt 123.0 lb

## 2021-09-21 DIAGNOSIS — E11319 Type 2 diabetes mellitus with unspecified diabetic retinopathy without macular edema: Secondary | ICD-10-CM

## 2021-09-21 LAB — COMPREHENSIVE METABOLIC PANEL
ALT: 42 U/L — ABNORMAL HIGH (ref 0–35)
AST: 23 U/L (ref 0–37)
Albumin: 4.1 g/dL (ref 3.5–5.2)
Alkaline Phosphatase: 78 U/L (ref 39–117)
BUN: 16 mg/dL (ref 6–23)
CO2: 26 mEq/L (ref 19–32)
Calcium: 9.8 mg/dL (ref 8.4–10.5)
Chloride: 99 mEq/L (ref 96–112)
Creatinine, Ser: 0.66 mg/dL (ref 0.40–1.20)
GFR: 109.1 mL/min (ref >60.00)
Glucose, Bld: 214 mg/dL — ABNORMAL HIGH (ref 70–99)
Potassium: 3.8 mEq/L (ref 3.5–5.1)
Sodium: 135 mEq/L (ref 135–145)
Total Bilirubin: 0.5 mg/dL (ref 0.2–1.2)
Total Protein: 8.3 g/dL (ref 6.0–8.3)

## 2021-09-21 LAB — MICROALBUMIN / CREATININE URINE RATIO
Creatinine,U: 27.7 mg/dL
Microalb Creat Ratio: 21.2 mg/g (ref 0.0–30.0)
Microalb, Ur: 5.9 mg/dL — ABNORMAL HIGH (ref 0.0–1.9)

## 2021-09-21 LAB — LDL CHOLESTEROL, DIRECT: Direct LDL: 108 mg/dL

## 2021-09-21 LAB — CBC WITH DIFFERENTIAL/PLATELET
Basophils Absolute: 0 10*3/uL (ref 0.0–0.1)
Basophils Relative: 0.4 % (ref 0.0–3.0)
Eosinophils Absolute: 0.1 10*3/uL (ref 0.0–0.7)
Eosinophils Relative: 0.6 % (ref 0.0–5.0)
HCT: 42 % (ref 36.0–46.0)
Hemoglobin: 13.5 g/dL (ref 12.0–15.0)
Lymphocytes Relative: 41.9 % (ref 12.0–46.0)
Lymphs Abs: 4.6 10*3/uL — ABNORMAL HIGH (ref 0.7–4.0)
MCHC: 32.1 g/dL (ref 30.0–36.0)
MCV: 79.3 fl (ref 78.0–100.0)
Monocytes Absolute: 0.5 10*3/uL (ref 0.1–1.0)
Monocytes Relative: 4.7 % (ref 3.0–12.0)
Neutro Abs: 5.8 10*3/uL (ref 1.4–7.7)
Neutrophils Relative %: 52.4 % (ref 43.0–77.0)
Platelets: 409 10*3/uL — ABNORMAL HIGH (ref 150.0–400.0)
RBC: 5.3 Mil/uL — ABNORMAL HIGH (ref 3.87–5.11)
RDW: 14.9 % (ref 11.5–15.5)
WBC: 11 10*3/uL — ABNORMAL HIGH (ref 4.0–10.5)

## 2021-09-21 LAB — LIPID PANEL
Cholesterol: 190 mg/dL (ref 0–200)
HDL: 51.5 mg/dL (ref >39.00)
NonHDL: 138.81
Total CHOL/HDL Ratio: 4
Triglycerides: 208 mg/dL — ABNORMAL HIGH (ref 0.0–149.0)
VLDL: 41.6 mg/dL — ABNORMAL HIGH (ref 0.0–40.0)

## 2021-09-21 LAB — HEMOGLOBIN A1C: Hgb A1c MFr Bld: 12.5 % — ABNORMAL HIGH (ref 4.6–6.5)

## 2021-09-21 MED ORDER — DAPAGLIFLOZIN PROPANEDIOL 10 MG PO TABS: 10.0000 mg | ORAL_TABLET | Freq: Every day | ORAL | Status: DC

## 2021-09-21 NOTE — Assessment & Plan Note (Addendum)
Chronic Lab Results  Component Value Date   HGBA1C 12.5 (A) 06/08/2021   Not controlled Continue metformin 500 mg bid, farxiga 10 mg daily-does not tolerate a higher dose of the metformin and is currently not taking the NPH insulin Has not followed up with Dr. Everardo All Does not check sugars Exercising, watching her diet Check A1c, urine microalbumin, CMP, lipid panel, CBC Consider adding Rybelsus if we can get this covered by her insurance depending on her A1c-we will discuss with pharmacist, but we will wait to see what the A1c is first

## 2021-09-21 NOTE — Patient Instructions (Addendum)
  Blood work was ordered.       Medications changes include :   none      Please followup in 3 months   

## 2021-09-25 MED ORDER — TRULICITY 0.75 MG/0.5ML ~~LOC~~ SOAJ: 0.7500 mg | SUBCUTANEOUS | 0 refills | Status: DC

## 2021-09-25 NOTE — Addendum Note (Signed)
Addended by: Pincus Sanes on: 09/25/2021 07:51 AM   Modules accepted: Orders

## 2021-10-09 ENCOUNTER — Telehealth: Payer: Self-pay

## 2021-10-09 NOTE — Progress Notes (Signed)
° ° °  Chronic Care Management Pharmacy Assistant   Name: Tranice Laduke  MRN: [id number] DOB: 23-Feb-1981  [name] is an 41 y.o. year old female who presents for his initial CCM visit with the clinical pharmacist.  Reason for Encounter: Initial Visit    Recent office visits:  09/21/21 Pincus Sanes, MD-PCP (diabetes mellitus) Blood work was ordered, Medications changes include :   none  Recent consult visits:  06/08/21 Romero Belling, MD-Endocrinology (Diabetes Mellitus)   Hospital visits:  None in previous 6 months  Medications: Outpatient Encounter Medications as of 10/09/2021  Medication Sig   dapagliflozin propanediol (FARXIGA) 10 MG TABS tablet Take 1 tablet (10 mg total) by mouth daily.   Dulaglutide (TRULICITY) 0.75 MG/0.5ML SOPN Inject 0.75 mg into the skin once a week.   metFORMIN (GLUCOPHAGE) 500 MG tablet TAKE 2 TABLETS(1000 MG) BY MOUTH TWICE DAILY WITH A MEAL   triamcinolone cream (KENALOG) 0.1 % Apply 1 application topically 2 (two) times daily.   No facility-administered encounter medications on file as of 10/09/2021.   Medication List: Dapagliflozin propanediol ( farxiga) 10 mg-last fill 12/06/20 30 ds Dulaglutide (Trulicity) 0.75 mg/0.5 ml sopn Metformin (Glucophage) 500 mg-last fill 07/18/21 90 ds Triamcinolone cream (kenalog) 0.1%  Care Gaps: Colonoscopy-NA Diabetic Foot Exam-06/08/21 Mammogram-NA Ophthalmology-12/03/18 Dexa Scan - NA Annual Well Visit - NA Micro albumin-09/21/21 Hemoglobin A1c- 09/21/21  Star Rating Drugs: Metformin 500 mg-last fill 07/18/21 90 ds   Velvet Bathe Clinical Pharmacist Assistant [phone]

## 2021-10-12 ENCOUNTER — Ambulatory Visit (INDEPENDENT_AMBULATORY_CARE_PROVIDER_SITE_OTHER): Payer: Medicaid Other

## 2021-10-12 ENCOUNTER — Other Ambulatory Visit: Payer: Self-pay

## 2021-10-12 ENCOUNTER — Ambulatory Visit: Payer: Self-pay

## 2021-10-12 DIAGNOSIS — E11319 Type 2 diabetes mellitus with unspecified diabetic retinopathy without macular edema: Secondary | ICD-10-CM

## 2021-10-12 NOTE — Progress Notes (Signed)
No chief complaint on file.  Patient arrives in office with son Rodman Key who translated for appointment.  Presents for diabetes evaluation, education, and management Patient was referred and last seen by Primary Care Provider on 09/21/2021.   Patient reports Diabetes was diagnosed in 2005 with gestational diabetes, has been managed for DM since.  -Patient A1c previously averaged ~8.5 - within last year elevated to 11.0+ - most recently 12.5% on 09/21/2021, no previous C peptide on file   Family/Social History: Grandmother, sister, daughter (type 2)  Insurance coverage/medication affordability: Medicaid [place]   Medication adherence reported reports adherence to farxiga and metformin - unable to tolerate more than 500mg  BID of metformin .   Current diabetes medications include:  Metformin 500mg  BID   Farxgia 10mg  daily Trulicity 0.75mg  weekly not currently using - not covered   Current hypertension medications include: n/a - BP controlled in office  Current hyperlipidemia medications include: n/a Previous Medications Tried/ Failed: Lantus, Novolin N, Onglyza, Pioglitazone  BP Readings from Last 3 Encounters:  09/21/21 120/82  06/08/21 (!) 144/90  03/09/21 126/80   Patient denies hypoglycemic events.  Patient reported dietary habits: Eats 2 meals/day Breakfast: Rice (small amount) and vegetables / fruits  Lunch: does not typically  Dinner: Rice, protein, vegetables  Snacks: does not typically snack Drinks: water  / on occasion will drink a diet drink  Patient-reported exercise habits: walks daily  - walks to and from work daily   Patient reports to ~20lbs of weight loss over the past year - endorses changes to diet and exercise within the past year    Patient reports nocturia (nighttime urination). On average 3 times nightly) Patient denies neuropathy (nerve pain). Patient denies visual changes. Patient denies self foot exams.    Lab Results  Component Value Date   HGBA1C 12.5  (H) 09/21/2021   There were no vitals filed for this visit.  Lipid Panel     Component Value Date/Time   CHOL 190 09/21/2021 1448   TRIG 208.0 (H) 09/21/2021 1448   HDL 51.50 09/21/2021 1448   CHOLHDL 4 09/21/2021 1448   VLDL 41.6 (H) 09/21/2021 1448   LDLCALC 102 (H) 09/01/2020 0956   LDLDIRECT 108.0 09/21/2021 1448    Home fasting blood sugars: n/a - not currently checking BG   2 hour post-meal/random blood sugars: n/a.  Patient has used fingerstick BG device in the past, willing to restart checking BG and documenting in log   Clinical Atherosclerotic Cardiovascular Disease (ASCVD): No  The 10-year ASCVD risk score (Arnett DK, et al., 2019) is: 4.2%   Values used to calculate the score:     Age: 41 years     Sex: Female     Is Non-Hispanic African American: No     Diabetic: Yes     Tobacco smoker: Yes     Systolic Blood Pressure: 123456 mmHg     Is BP treated: No     HDL Cholesterol: 51.5 mg/dL     Total Cholesterol: 190 mg/dL   A/P: Diabetes longstanding since 2005 currently last A1c elevated at 12.5%. Patient is able to verbalize appropriate hypoglycemia management plan. Control is suboptimal due to not being able to start trulicity since being prescribed . - Continued Metformin 500mg  - 2 tablets twice daily  - Samples (2)  -Trulicity [id number] weekly   -patient reports no prior history of pancreatitis / no family history of thyroid cancer  -Continued -Farxiga 10mg  daily. (Through [place] and Me) -Patient agreeable to  start checking blood sugars at least 2 times weekly, encouraged daily testing if possible     -Extensively discussed pathophysiology of diabetes, recommended lifestyle interventions, dietary effects on blood sugar control -Counseled on s/sx of and management of hypoglycemia -Next A1C anticipated 12/20/2021.   ASCVD risk - primary prevention in patient with diabetes. Last LDL is not controlled. ASCVD risk score is not >20%  - moderate intensity statin indicated.   -Start Atorvastatin 10 mg.    Recommendations  Start: Trulicity 0.75mg  weekly  - samples for trulicity set aside for patient - PAP forms started - patient / son to come into office to complete  Start: Atorvastatin 10mg  daily   Recommended for patient to talk with medicaid office about switching from medicaid family planning plan to [facility] plan as her current medications would be covered   Total time in face to face counseling 35 minutes.   Follow up Pharmacist/PCP 2 weeks    Tomasa Blase, PharmD Clinical Pharmacist, [place]

## 2021-10-14 ENCOUNTER — Other Ambulatory Visit: Payer: Self-pay | Admitting: Internal Medicine

## 2021-10-14 MED ORDER — ATORVASTATIN CALCIUM 10 MG PO TABS: 10.0000 mg | ORAL_TABLET | Freq: Every day | ORAL | 3 refills | Status: DC

## 2021-10-26 ENCOUNTER — Ambulatory Visit: Payer: Medicaid Other

## 2021-10-26 NOTE — Progress Notes (Signed)
No chief complaint on file.  Spoke with patient's sister Harvie Bridge and son Molli Hazard  -Patient has started atorvastatin per Molli Hazard - denies any issues at this time - trulicity PAP has not been completed as of this time, [facility] has put a hold on assistance for trulicity - trulicity samples were not started.  Patient previously prescribed NPH insulin by endo but was unable to afford   Patient has not followed up with medicaid office about applying to a medicaid plan (currently has medicaid family planning - DM medications are not covered)  Family/Social History: Grandmother, sister, daughter (type 2)  Insurance coverage/medication affordability: Medicaid  Family Planning   Medication adherence reported reports adherence to farxiga and metformin - unable to tolerate more than 500mg  BID of metformin .   Current diabetes medications include:  Metformin 500mg  BID   Farxgia 10mg  daily Trulicity 0.75mg  weekly not currently using - not covered   Current hypertension medications include: n/a - BP controlled in office  Current hyperlipidemia medications include: n/a Previous Medications Tried/ Failed: Lantus, Novolin N, Onglyza, Pioglitazone, NPH insulin (cost)  BP Readings from Last 3 Encounters:  09/21/21 120/82  06/08/21 (!) 144/90  03/09/21 126/80   Patient denies hypoglycemic events.  Patient reported dietary habits: Eats 2 meals/day Breakfast: Rice (small amount) and vegetables / fruits  Lunch: does not typically  Dinner: Rice, protein, vegetables  Snacks: does not typically snack Drinks: water  / on occasion will drink a diet drink  Patient-reported exercise habits: walks daily  - walks to and from work daily   Patient reports to ~20lbs of weight loss over the past year - endorses changes to diet and exercise within the past year    Patient reports nocturia (nighttime urination). On average 3 times nightly) Patient denies neuropathy (nerve pain). Patient denies  visual changes. Patient denies self foot exams.    Lab Results  Component Value Date   HGBA1C 12.5 (H) 09/21/2021   There were no vitals filed for this visit.  Lipid Panel     Component Value Date/Time   CHOL 190 09/21/2021 1448   TRIG 208.0 (H) 09/21/2021 1448   HDL 51.50 09/21/2021 1448   CHOLHDL 4 09/21/2021 1448   VLDL 41.6 (H) 09/21/2021 1448   LDLCALC 102 (H) 09/01/2020 0956   LDLDIRECT 108.0 09/21/2021 1448    Home fasting blood sugars: n/a - not currently checking BG   2 hour post-meal/random blood sugars: n/a.  Patient has used fingerstick BG device in the past, willing to restart checking BG and documenting in log   Clinical Atherosclerotic Cardiovascular Disease (ASCVD): No  The 10-year ASCVD risk score (Arnett DK, et al., 2019) is: 4.2%   Values used to calculate the score:     Age: 41 years     Sex: Female     Is Non-Hispanic African American: No     Diabetic: Yes     Tobacco smoker: Yes     Systolic Blood Pressure: 120 mmHg     Is BP treated: No     HDL Cholesterol: 51.5 mg/dL     Total Cholesterol: 190 mg/dL   A/P: Diabetes longstanding since 2005 currently last A1c elevated at 12.5%. Patient is able to verbalize appropriate hypoglycemia management plan. Control is suboptimal due cost of medications, has been unable to start many medications which have been prescribed to her - Continued Metformin 500mg  - 2 tablets twice daily  -Continued -Farxiga 10mg  daily. (Through [place] and Me) -Patient agreeable  to start checking blood sugars at least 2 times weekly, encouraged daily testing if possible     -Extensively discussed pathophysiology of diabetes, recommended lifestyle interventions, dietary effects on blood sugar control -Counseled on s/sx of and management of hypoglycemia -Next A1C anticipated 12/20/2021.   ASCVD risk - primary prevention in patient with diabetes. Last LDL is not controlled. ASCVD risk score is not >20%  - moderate intensity statin  indicated.  -Start Atorvastatin 10 mg.    Recommendations  Complete Ozempic PAP (sent to patient's sister Mae today) Mae/ Molli Hazard will submit application for [place] medicaid   Follow up Pharmacist/PCP 2 weeks    Ellin Saba, PharmD Clinical Pharmacist, [facility]

## 2021-11-02 ENCOUNTER — Telehealth: Payer: Medicaid Other

## 2021-11-09 ENCOUNTER — Ambulatory Visit: Payer: Medicaid Other

## 2021-11-09 NOTE — Progress Notes (Signed)
No chief complaint on file.  Spoke with patient's sister Mae   -Ozempic PAP has not been completed, resent patient portion to sister Mae   Patient previously prescribed NPH insulin by endo but was unable to afford   Patient was to [facility] office and was told she does not qualify for any other type of medicaid, would need to apply for disability  Sister is planning to take patient this weekend to apply for disability    Family/Social History: Grandmother, sister, daughter (type 2)  Insurance coverage/medication affordability: Medicaid [place] [employer]   Medication adherence reported reports adherence to farxiga and metformin - unable to tolerate more than 500mg  BID of metformin .   Current diabetes medications include:  Metformin 500mg  BID   Farxgia 10mg  daily  Current hypertension medications include: n/a - BP controlled in office  Current hyperlipidemia medications include: n/a Previous Medications Tried/ Failed: Lantus, Novolin N, Onglyza, Pioglitazone, NPH insulin (cost)  BP Readings from Last 3 Encounters:  09/21/21 120/82  06/08/21 (!) 144/90  03/09/21 126/80   Patient denies hypoglycemic events.  Patient reported dietary habits: Eats 2 meals/day Breakfast: Rice (small amount) and vegetables / fruits  Lunch: does not typically  Dinner: Rice, protein, vegetables  Snacks: does not typically snack Drinks: water  / on occasion will drink a diet drink  Patient-reported exercise habits: walks daily  - walks to and from work daily   Patient reports to ~20lbs of weight loss over the past year - endorses changes to diet and exercise within the past year    Patient reports nocturia (nighttime urination). On average 3 times nightly) Patient denies neuropathy (nerve pain). Patient denies visual changes. Patient denies self foot exams.    Lab Results  Component Value Date   HGBA1C 12.5 (H) 09/21/2021   There were no vitals filed for this visit.  Lipid Panel      Component Value Date/Time   CHOL 190 09/21/2021 1448   TRIG 208.0 (H) 09/21/2021 1448   HDL 51.50 09/21/2021 1448   CHOLHDL 4 09/21/2021 1448   VLDL 41.6 (H) 09/21/2021 1448   LDLCALC 102 (H) 09/01/2020 0956   LDLDIRECT 108.0 09/21/2021 1448    Home fasting blood sugars: n/a - not currently checking BG   2 hour post-meal/random blood sugars: n/a.  Patient has used fingerstick BG device in the past, willing to restart checking BG and documenting in log   Clinical Atherosclerotic Cardiovascular Disease (ASCVD): No  The 10-year ASCVD risk score (Arnett DK, et al., 2019) is: 4.2%   Values used to calculate the score:     Age: 41 years     Sex: Female     Is Non-Hispanic African American: No     Diabetic: Yes     Tobacco smoker: Yes     Systolic Blood Pressure: 123456 mmHg     Is BP treated: No     HDL Cholesterol: 51.5 mg/dL     Total Cholesterol: 190 mg/dL   A/P: Diabetes longstanding since 2005 currently last A1c elevated at 12.5%. Patient is able to verbalize appropriate hypoglycemia management plan. Control is suboptimal due cost of medications, has been unable to start many medications which have been prescribed to her - Continued Metformin 500mg  - 2 tablets twice daily  -Continued -Farxiga 10mg  daily. (Through [employer] and Me) -Patient agreeable to start checking blood sugars at least 2 times weekly, encouraged daily testing if possible - patient has not been checking blood sugars     -  Extensively discussed pathophysiology of diabetes, recommended lifestyle interventions, dietary effects on blood sugar control -Counseled on s/sx of and management of hypoglycemia -Next A1C anticipated 12/20/2021.   ASCVD risk - primary prevention in patient with diabetes. Last LDL is not controlled. ASCVD risk score is not >20%  - moderate intensity statin indicated.  -taking atorvastatin 10mg  daily   Recommendations  Complete Ozempic PAP (sent to patient's sister Hme today) Hme/ Rodman Key will help  with application for disability  - recommended for appointment with [facility] to establish care and discuss different resources available to patient to help with financial burden/ limitations  Follow up Pharmacist/PCP 2 weeks    Tomasa Blase, PharmD Clinical Pharmacist, [facility]

## 2021-11-22 ENCOUNTER — Ambulatory Visit: Payer: Medicaid Other

## 2021-11-22 NOTE — Progress Notes (Signed)
? ?  Spoke with patient's sister Hme ? ?-Ozempic PAP not completed at this time, sister who is helping with application had questions about completing patient portion ? ?-Per sister - patient is on the wait list for [facility] - plans to start following once she is scheduled for initial appointment  ?  ?Recommendations  ?Complete Ozempic PAP - patient's sister Hme to fax patient portion by end of the week - will submit ozempic application once received  ? ?Follow up Pharmacist/PCP 2 weeks   ? ?Ellin Saba, PharmD ?Clinical Pharmacist, [facility]  ?

## 2021-12-27 ENCOUNTER — Encounter: Payer: Self-pay | Admitting: Internal Medicine

## 2021-12-27 NOTE — Patient Instructions (Signed)
     Blood work was ordered.     Medications changes include :   none   Your prescription(s) have been sent to your pharmacy.    A referral was ordered for [facility] for a colonoscopy.     Someone from that office will call you to schedule an appointment.    Return in about 6 months (around 10/17/2022) for Physical Exam.   [facility] Phone: [phone]  

## 2021-12-27 NOTE — Progress Notes (Signed)
? ? ? ? ?  Subjective:  ? ? Patient ID: [name], female    DOB: 04-08-1981, 41 y.o.   MRN: [id number] ? ?This visit occurred during the SARS-CoV-2 public health emergency.  Safety protocols were in place, including screening questions prior to the visit, additional usage of staff PPE, and extensive cleaning of exam room while observing appropriate contact time as indicated for disinfecting solutions.   ? ? ?HPI ?[name] is here for follow up of her chronic medical problems, including uncontrolled DM ? ?Insulin not covered ?Trulicity not covered ? ? ?Awaiting PAP for ozempic ? ? ?? glyburide ? ?Medications and allergies reviewed with patient and updated if appropriate. ? ?Current Outpatient Medications on File Prior to Visit  ?Medication Sig Dispense Refill  ? atorvastatin (LIPITOR) 10 MG tablet Take 1 tablet (10 mg total) by mouth daily. 90 tablet 3  ? dapagliflozin propanediol (FARXIGA) 10 MG TABS tablet Take 1 tablet (10 mg total) by mouth daily. 30 tablet   ? Dulaglutide (TRULICITY) 0.75 MG/0.5ML SOPN Inject 0.75 mg into the skin once a week. (Patient not taking: Reported on 10/12/2021) 2 mL 0  ? metFORMIN (GLUCOPHAGE) 500 MG tablet TAKE 2 TABLETS(1000 MG) BY MOUTH TWICE DAILY WITH A MEAL (Patient taking differently: Take 500 mg by mouth 2 (two) times daily with a meal.) 360 tablet 1  ? ?No current facility-administered medications on file prior to visit.  ? ? ? ?Review of Systems ? ?   ?Objective:  ?There were no vitals filed for this visit. ?BP Readings from Last 3 Encounters:  ?09/21/21 120/82  ?06/08/21 (!) 144/90  ?03/09/21 126/80  ? ?Wt Readings from Last 3 Encounters:  ?09/21/21 123 lb (55.8 kg)  ?06/08/21 125 lb (56.7 kg)  ?03/09/21 129 lb (58.5 kg)  ? ?There is no height or weight on file to calculate BMI. ? ?  ?Physical Exam ?   ? ?Lab Results  ?Component Value Date  ? WBC 11.0 (H) 09/21/2021  ? HGB 13.5 09/21/2021  ? HCT 42.0 09/21/2021  ? PLT 409.0 (H) 09/21/2021  ? GLUCOSE 214 (H) 09/21/2021  ? CHOL  190 09/21/2021  ? TRIG 208.0 (H) 09/21/2021  ? HDL 51.50 09/21/2021  ? LDLDIRECT 108.0 09/21/2021  ? LDLCALC 102 (H) 09/01/2020  ? ALT 42 (H) 09/21/2021  ? AST 23 09/21/2021  ? NA 135 09/21/2021  ? K 3.8 09/21/2021  ? CL 99 09/21/2021  ? CREATININE 0.66 09/21/2021  ? BUN 16 09/21/2021  ? CO2 26 09/21/2021  ? TSH 1.42 01/23/2016  ? HGBA1C 12.5 (H) 09/21/2021  ? MICROALBUR 5.9 (H) 09/21/2021  ? ? ? ?Assessment & Plan:  ? ? ?See Problem List for Assessment and Plan of chronic medical problems.  ? ?This encounter was created in error - please disregard. ?

## 2021-12-28 ENCOUNTER — Encounter: Payer: Medicaid Other | Admitting: Internal Medicine

## 2021-12-28 DIAGNOSIS — E11319 Type 2 diabetes mellitus with unspecified diabetic retinopathy without macular edema: Secondary | ICD-10-CM

## 2022-08-22 NOTE — Progress Notes (Deleted)
      Subjective:    Patient ID: [name], female    DOB: 09-24-1980, 41 y.o.   MRN: [id number]     HPI Halena is here for follow up of her chronic medical problems, including DM    Medications and allergies reviewed with patient and updated if appropriate.  Current Outpatient Medications on File Prior to Visit  Medication Sig Dispense Refill   atorvastatin (LIPITOR) 10 MG tablet Take 1 tablet (10 mg total) by mouth daily. 90 tablet 3   dapagliflozin propanediol (FARXIGA) 10 MG TABS tablet Take 1 tablet (10 mg total) by mouth daily. 30 tablet    Dulaglutide (TRULICITY) 0.75 MG/0.5ML SOPN Inject 0.75 mg into the skin once a week. (Patient not taking: Reported on 10/12/2021) 2 mL 0   metFORMIN (GLUCOPHAGE) 500 MG tablet TAKE 2 TABLETS(1000 MG) BY MOUTH TWICE DAILY WITH A MEAL (Patient taking differently: Take 500 mg by mouth 2 (two) times daily with a meal.) 360 tablet 1   No current facility-administered medications on file prior to visit.     Review of Systems     Objective:  There were no vitals filed for this visit. BP Readings from Last 3 Encounters:  09/21/21 120/82  06/08/21 (!) 144/90  03/09/21 126/80   Wt Readings from Last 3 Encounters:  09/21/21 123 lb (55.8 kg)  06/08/21 125 lb (56.7 kg)  03/09/21 129 lb (58.5 kg)   There is no height or weight on file to calculate BMI.    Physical Exam     Lab Results  Component Value Date   WBC 11.0 (H) 09/21/2021   HGB 13.5 09/21/2021   HCT 42.0 09/21/2021   PLT 409.0 (H) 09/21/2021   GLUCOSE 214 (H) 09/21/2021   CHOL 190 09/21/2021   TRIG 208.0 (H) 09/21/2021   HDL 51.50 09/21/2021   LDLDIRECT 108.0 09/21/2021   LDLCALC 102 (H) 09/01/2020   ALT 42 (H) 09/21/2021   AST 23 09/21/2021   NA 135 09/21/2021   K 3.8 09/21/2021   CL 99 09/21/2021   CREATININE 0.66 09/21/2021   BUN 16 09/21/2021   CO2 26 09/21/2021   TSH 1.42 01/23/2016   HGBA1C 12.5 (H) 09/21/2021   MICROALBUR 5.9 (H) 09/21/2021      Assessment & Plan:    See Problem List for Assessment and Plan of chronic medical problems.

## 2022-08-23 ENCOUNTER — Ambulatory Visit: Payer: Medicaid Other | Admitting: Internal Medicine

## 2022-08-23 DIAGNOSIS — E11319 Type 2 diabetes mellitus with unspecified diabetic retinopathy without macular edema: Secondary | ICD-10-CM

## 2022-09-02 ENCOUNTER — Ambulatory Visit: Payer: Medicaid Other | Admitting: Internal Medicine

## 2022-09-19 NOTE — Patient Instructions (Addendum)
      Blood work was ordered.   The lab is on the first floor.    Medications changes include :   dexcom monitoring device for sugars. Restart trulicity, metformin, farxiga and atorvastatin.      Return in about 3 months (around 12/20/2022) for follow up.

## 2022-09-19 NOTE — Progress Notes (Signed)
Subjective:    Patient ID: [name], female    DOB: 1981-07-18, 42 y.o.   MRN: [id number]     HPI [name] is here for follow up of her chronic medical problems, including DM.  She is here today with her sister who speaks [place] and translates for her.  A1c today 13.5  She has been out of insurance and was not able to take any of her medication has not been taking anything.  She just got new insurance which is Medicaid and wants to restart her medication.  She has no concerns.  Medications and allergies reviewed with patient and updated if appropriate.  Current Outpatient Medications on File Prior to Visit  Medication Sig Dispense Refill   atorvastatin (LIPITOR) 10 MG tablet Take 1 tablet (10 mg total) by mouth daily. 90 tablet 3   dapagliflozin propanediol (FARXIGA) 10 MG TABS tablet Take 1 tablet (10 mg total) by mouth daily. 30 tablet    metFORMIN (GLUCOPHAGE) 500 MG tablet TAKE 2 TABLETS(1000 MG) BY MOUTH TWICE DAILY WITH A MEAL (Patient taking differently: Take 500 mg by mouth 2 (two) times daily with a meal.) 360 tablet 1   No current facility-administered medications on file prior to visit.     Review of Systems  Constitutional:  Negative for chills and fever.  Eyes:  Negative for visual disturbance.  Respiratory:  Negative for cough, shortness of breath and wheezing.   Cardiovascular:  Negative for chest pain, palpitations and leg swelling.  Neurological:  Negative for light-headedness, numbness and headaches.       Objective:   Vitals:   09/20/22 1436  BP: 124/78  Pulse: (!) 105  Temp: 98.8 F (37.1 C)  SpO2: 98%   BP Readings from Last 3 Encounters:  09/20/22 124/78  09/21/21 120/82  06/08/21 (!) 144/90   Wt Readings from Last 3 Encounters:  09/20/22 128 lb (58.1 kg)  09/21/21 123 lb (55.8 kg)  06/08/21 125 lb (56.7 kg)   Body mass index is 23.41 kg/m.    Physical Exam Constitutional:      General: She is not in acute distress.     Appearance: Normal appearance.  HENT:     Head: Normocephalic and atraumatic.  Eyes:     Conjunctiva/sclera: Conjunctivae normal.  Cardiovascular:     Rate and Rhythm: Normal rate and regular rhythm.     Heart sounds: Normal heart sounds. No murmur heard. Pulmonary:     Effort: Pulmonary effort is normal. No respiratory distress.     Breath sounds: Normal breath sounds. No wheezing.  Musculoskeletal:     Cervical back: Neck supple.     Right lower leg: No edema.     Left lower leg: No edema.  Lymphadenopathy:     Cervical: No cervical adenopathy.  Skin:    General: Skin is warm and dry.     Findings: No rash.  Neurological:     Mental Status: She is alert. Mental status is at baseline.  Psychiatric:        Mood and Affect: Mood normal.        Behavior: Behavior normal.        Lab Results  Component Value Date   WBC 11.0 (H) 09/21/2021   HGB 13.5 09/21/2021   HCT 42.0 09/21/2021   PLT 409.0 (H) 09/21/2021   GLUCOSE 214 (H) 09/21/2021   CHOL 190 09/21/2021   TRIG 208.0 (H) 09/21/2021   HDL 51.50 09/21/2021  LDLDIRECT 108.0 09/21/2021   LDLCALC 102 (H) 09/01/2020   ALT 42 (H) 09/21/2021   AST 23 09/21/2021   NA 135 09/21/2021   K 3.8 09/21/2021   CL 99 09/21/2021   CREATININE 0.66 09/21/2021   BUN 16 09/21/2021   CO2 26 09/21/2021   TSH 1.42 01/23/2016   HGBA1C 12.5 (H) 09/21/2021   MICROALBUR 5.9 (H) 09/21/2021     Assessment & Plan:    See Problem List for Assessment and Plan of chronic medical problems.

## 2022-09-20 ENCOUNTER — Telehealth: Payer: Self-pay | Admitting: Family Medicine

## 2022-09-20 ENCOUNTER — Ambulatory Visit: Payer: Medicaid Other | Admitting: Internal Medicine

## 2022-09-20 ENCOUNTER — Encounter: Payer: Self-pay | Admitting: Internal Medicine

## 2022-09-20 VITALS — BP 124/78 | HR 105 | Temp 98.8°F | Ht 62.0 in | Wt 128.0 lb

## 2022-09-20 DIAGNOSIS — E11319 Type 2 diabetes mellitus with unspecified diabetic retinopathy without macular edema: Secondary | ICD-10-CM | POA: Diagnosis not present

## 2022-09-20 DIAGNOSIS — Z794 Long term (current) use of insulin: Secondary | ICD-10-CM | POA: Diagnosis not present

## 2022-09-20 DIAGNOSIS — R1310 Dysphagia, unspecified: Secondary | ICD-10-CM | POA: Insufficient documentation

## 2022-09-20 LAB — CBC WITH DIFFERENTIAL/PLATELET
Basophils Absolute: 0.1 10*3/uL (ref 0.0–0.1)
Basophils Relative: 0.6 % (ref 0.0–3.0)
Eosinophils Absolute: 0.1 10*3/uL (ref 0.0–0.7)
Eosinophils Relative: 1.2 % (ref 0.0–5.0)
HCT: 40.6 % (ref 36.0–46.0)
Hemoglobin: 12.9 g/dL (ref 12.0–15.0)
Lymphocytes Relative: 30.7 % (ref 12.0–46.0)
Lymphs Abs: 3.2 10*3/uL (ref 0.7–4.0)
MCHC: 31.7 g/dL (ref 30.0–36.0)
MCV: 77.3 fl — ABNORMAL LOW (ref 78.0–100.0)
Monocytes Absolute: 0.6 10*3/uL (ref 0.1–1.0)
Monocytes Relative: 6.2 % (ref 3.0–12.0)
Neutro Abs: 6.4 10*3/uL (ref 1.4–7.7)
Neutrophils Relative %: 61.3 % (ref 43.0–77.0)
Platelets: 409 10*3/uL — ABNORMAL HIGH (ref 150.0–400.0)
RBC: 5.26 Mil/uL — ABNORMAL HIGH (ref 3.87–5.11)
RDW: 15.1 % (ref 11.5–15.5)
WBC: 10.5 10*3/uL (ref 4.0–10.5)

## 2022-09-20 LAB — POCT GLYCOSYLATED HEMOGLOBIN (HGB A1C): Hemoglobin A1C: 13.5 % — AB (ref 4.0–5.6)

## 2022-09-20 LAB — COMPREHENSIVE METABOLIC PANEL
ALT: 34 U/L (ref 0–35)
AST: 25 U/L (ref 0–37)
Albumin: 4.1 g/dL (ref 3.5–5.2)
Alkaline Phosphatase: 74 U/L (ref 39–117)
BUN: 10 mg/dL (ref 6–23)
CO2: 30 mEq/L (ref 19–32)
Calcium: 10 mg/dL (ref 8.4–10.5)
Chloride: 94 mEq/L — ABNORMAL LOW (ref 96–112)
Creatinine, Ser: 0.63 mg/dL (ref 0.40–1.20)
GFR: 109.56 mL/min (ref >60.00)
Glucose, Bld: 523 mg/dL (ref 70–99)
Potassium: 4.4 mEq/L (ref 3.5–5.1)
Sodium: 132 mEq/L — ABNORMAL LOW (ref 135–145)
Total Bilirubin: 0.4 mg/dL (ref 0.2–1.2)
Total Protein: 7.8 g/dL (ref 6.0–8.3)

## 2022-09-20 LAB — LIPID PANEL
Cholesterol: 214 mg/dL — ABNORMAL HIGH (ref 0–200)
HDL: 59.7 mg/dL (ref >39.00)
NonHDL: 154.25
Total CHOL/HDL Ratio: 4
Triglycerides: 275 mg/dL — ABNORMAL HIGH (ref 0.0–149.0)
VLDL: 55 mg/dL — ABNORMAL HIGH (ref 0.0–40.0)

## 2022-09-20 LAB — LDL CHOLESTEROL, DIRECT: Direct LDL: 120 mg/dL

## 2022-09-20 MED ORDER — TRULICITY 0.75 MG/0.5ML ~~LOC~~ SOAJ: 0.7500 mg | SUBCUTANEOUS | 0 refills | Status: DC

## 2022-09-20 MED ORDER — DAPAGLIFLOZIN PROPANEDIOL 10 MG PO TABS: 10.0000 mg | ORAL_TABLET | Freq: Every day | ORAL | Status: DC

## 2022-09-20 MED ORDER — ATORVASTATIN CALCIUM 10 MG PO TABS: 10.0000 mg | ORAL_TABLET | Freq: Every day | ORAL | 3 refills | Status: DC

## 2022-09-20 MED ORDER — METFORMIN HCL 500 MG PO TABS: 500.0000 mg | ORAL_TABLET | Freq: Two times a day (BID) | ORAL | 2 refills | Status: DC

## 2022-09-20 MED ORDER — DEXCOM G7 RECEIVER DEVI: 0 refills | Status: DC

## 2022-09-20 MED ORDER — DEXCOM G7 SENSOR MISC: 5 refills | Status: DC

## 2022-09-20 NOTE — Telephone Encounter (Signed)
7:04 PM Call from answering service, critical lab called with blood glucose 523 reported 3:27 PM.  Access nurse tried to call patient 3 times at home number to assess but was unable to reach.  Note reviewed from Dr. Quay Burow today, had been without medications, no mention of any symptoms at visit today.  Was restarted on medications.  CMP reviewed.  Glucose 523, sodium 132, CO2 was normal at 30.   I also called home number - no answer. left VM that I will call other numbers.  called patient at alternate number - mobile [phone]. no answer. unable to leave VM.  called alternate contact  sister at [phone], this is number for her sister Hdua. she is not with her sister Shanteria, but will contact her tonight and have her call us to discuss results. number provided for LBSV and advised to let answering service know that I am expeciting her call.

## 2022-09-20 NOTE — Assessment & Plan Note (Addendum)
Chronic   Lab Results  Component Value Date   HGBA1C 12.5 (H) 09/21/2021   Sugars very uncontrolled Has been without her medications because she has not had insurance A1c here today-13.5 Will start her with Dexcom if covered Restart metformin 500 mg twice daily, Farxiga 10 mg daily and Trulicity 4.09 mg if we can get that covered-if the Trulicity is not covered may need to request prior authorization or try for alternative.  Need to make sure that this does not reduce her appetite too much because she does not need to lose weight She does not eat a lot of sweets, but does consume a lot of rice-encouraged trying to decrease the rice slightly and increase her intake of vegetables Restart atorvastatin 10 mg daily

## 2022-09-21 ENCOUNTER — Telehealth: Payer: Self-pay | Admitting: Family Medicine

## 2022-09-21 MED ORDER — DAPAGLIFLOZIN PROPANEDIOL 10 MG PO TABS: 10.0000 mg | ORAL_TABLET | Freq: Every day | ORAL | 2 refills | Status: DC

## 2022-09-21 NOTE — Telephone Encounter (Signed)
As I have not heard from patient last night or today, repeat call placed to home number, no answer, unable to leave voicemail. Called mobile - spoke with other sister Hme who is her emergency contact.  Advised of lab results.  Sister is feeling well. No n/v/abd pain or confusion. Feeling ok. Unable to check home blood sugars. Took metformin - s/p 3 doses. Unable to get farxiga - did not see order at pharmacy, plans to start trulicity tomorrow.  Appears Wilder Glade was set no print.  I reordered that to her pharmacy.  ER precautions given in the next few days, recommended home monitoring of blood sugar with traditional meter until continuous glucose monitor available.  Sister expressed understanding and reports she has been closely following her and helping her manage her meds at this time.  Aware of reasons to be seen in ER, all questions answered.  Note sent to Dr. Quay Burow as Juluis Rainier.

## 2022-09-21 NOTE — Telephone Encounter (Signed)
Opened in error. See other note.

## 2022-09-23 ENCOUNTER — Telehealth: Payer: Self-pay

## 2022-09-23 ENCOUNTER — Other Ambulatory Visit: Payer: Self-pay | Admitting: Internal Medicine

## 2022-09-23 NOTE — Telephone Encounter (Signed)
[facility] (Key: [id number])  Need Help? Call us at [phone]  Outcome  Additional Information Required: This request cannot be processed due to the medication is not covered by the plan. Drug Dapagliflozin Propanediol 10MG  tablets  ePA cloud logo  Form  [facility] [place] [employer] [place] Form [phone] NCPDP)

## 2022-09-26 MED ORDER — BLOOD GLUCOSE MONITOR KIT: PACK | 0 refills | Status: AC

## 2022-09-26 NOTE — Addendum Note (Signed)
Addended by: Binnie Rail on: 09/26/2022 07:39 PM   Modules accepted: Orders

## 2022-09-30 ENCOUNTER — Other Ambulatory Visit (HOSPITAL_COMMUNITY): Payer: Self-pay

## 2022-10-18 ENCOUNTER — Telehealth: Payer: Self-pay | Admitting: Internal Medicine

## 2022-10-18 NOTE — Telephone Encounter (Signed)
Patient sister called about patients trulicity, states pharmacy is waiting on a reply on the refill.

## 2022-10-21 ENCOUNTER — Other Ambulatory Visit: Payer: Self-pay

## 2022-10-21 MED ORDER — TRULICITY 0.75 MG/0.5ML ~~LOC~~ SOAJ: 0.7500 mg | SUBCUTANEOUS | 0 refills | Status: DC

## 2022-10-21 NOTE — Telephone Encounter (Signed)
Refill sent in today

## 2022-10-24 ENCOUNTER — Telehealth: Payer: Self-pay | Admitting: Internal Medicine

## 2022-10-24 NOTE — Telephone Encounter (Signed)
Form dropped off for Dr. Quay Burow. Patient requests call when ready to pick up - [phone]. Form placed in Dr. Quay Burow' box up front.

## 2022-10-24 NOTE — Telephone Encounter (Signed)
Form placed in folder.

## 2022-11-20 ENCOUNTER — Other Ambulatory Visit: Payer: Self-pay | Admitting: Internal Medicine

## 2022-11-21 ENCOUNTER — Other Ambulatory Visit: Payer: Self-pay

## 2022-12-03 ENCOUNTER — Telehealth: Payer: Self-pay | Admitting: Internal Medicine

## 2022-12-03 ENCOUNTER — Other Ambulatory Visit: Payer: Self-pay

## 2022-12-03 NOTE — Telephone Encounter (Signed)
Prescription Request  12/03/2022  LOV: 09/20/2022  What is the name of the medication or equipment?    metFORMIN (GLUCOPHAGE) [id number] MG tablet   TRULICITY [id number] SOPN   Pt is completely out of her medications Pt sister been calling since last week to get prescriptions refilled  Have you contacted your pharmacy to request a refill? No   Which pharmacy would you like this sent to?  [facility] [id number], [place] [address] [place] [address] [place] [address] Phone: [phone] Fax: [phone]  [place] ([place]), [place] - [address] [place] [address] Phone: [phone] Fax: [phone]    Patient notified that their request is being sent to the clinical staff for review and that they should receive a response within 2 business days.   Please advise at Mobile [phone] (mobile)

## 2022-12-03 NOTE — Telephone Encounter (Signed)
Called Pt and left voicemail to give the office a callback.

## 2022-12-17 ENCOUNTER — Telehealth: Payer: Self-pay

## 2022-12-17 NOTE — Telephone Encounter (Signed)
Pharmacy Patient Advocate Encounter  Received notification from [facility] that the request for prior authorization for Dexcom G7 receiver has been denied because the insurance do not cover the requested medication under this member's benefit plan.    Please be advised we currently do not have a Pharmacist to review denials, therefore you will need to process appeals accordingly as needed. Thanks for your support at this time.   You may call [phone] for member benefit questions  Denial letter attached to chart

## 2022-12-17 NOTE — Telephone Encounter (Signed)
I did send a glucometer prescription to her pharmacy in January so she should have that at home

## 2022-12-17 NOTE — Addendum Note (Signed)
Addended by: Binnie Rail on: 12/17/2022 12:09 PM   Modules accepted: Orders

## 2022-12-20 ENCOUNTER — Ambulatory Visit: Payer: Medicaid Other | Admitting: Internal Medicine

## 2023-01-02 ENCOUNTER — Encounter: Payer: Self-pay | Admitting: Internal Medicine

## 2023-01-02 DIAGNOSIS — E785 Hyperlipidemia, unspecified: Secondary | ICD-10-CM | POA: Insufficient documentation

## 2023-01-02 DIAGNOSIS — E1169 Type 2 diabetes mellitus with other specified complication: Secondary | ICD-10-CM | POA: Insufficient documentation

## 2023-01-02 NOTE — Progress Notes (Signed)
Subjective:    Patient ID: [name], female    DOB: Aug 09, 1981, 42 y.o.   MRN: [id number]     HPI [name] is here for follow up of her chronic medical problems.  Trulicity-feels bloated and losing weight, dec appetite.    Sugar 126, 128 -- after meal 180, 220  Occasionally she has numbness in her hands or feet.  For the past 3 days she has had some numbness and itching in her left toe in a certain area.  It hurts when she scratches it, but otherwise has no pain.  Medications and allergies reviewed with patient and updated if appropriate.  Current Outpatient Medications on File Prior to Visit  Medication Sig Dispense Refill   atorvastatin (LIPITOR) 10 MG tablet Take 1 tablet (10 mg total) by mouth daily. 90 tablet 3   blood glucose meter kit and supplies KIT Dispense based on patient and insurance preference. Use up to four times daily as directed. (FOR E11.9). 1 each 0   metFORMIN (GLUCOPHAGE) 500 MG tablet Take 1 tablet (500 mg total) by mouth 2 (two) times daily with a meal. 180 tablet 2   TRULICITY 0.75 MG/0.5ML SOPN ADMINISTER 0.75 MG UNDER THE SKIN 1 TIME A WEEK 2 mL 0   No current facility-administered medications on file prior to visit.     Review of Systems  Constitutional:  Positive for appetite change. Negative for fever.  Respiratory:  Negative for cough, shortness of breath and wheezing.   Cardiovascular:  Negative for chest pain, palpitations and leg swelling.  Gastrointestinal:  Positive for abdominal distention, abdominal pain and constipation.  Musculoskeletal:  Positive for back pain (sharp pain in her back).  Neurological:  Positive for numbness. Negative for light-headedness and headaches.       Objective:   Vitals:   01/03/23 1345  BP: 114/74  Pulse: 100  Temp: 98.5 F (36.9 C)  SpO2: 98%   BP Readings from Last 3 Encounters:  01/03/23 114/74  09/20/22 124/78  09/21/21 120/82   Wt Readings from Last 3 Encounters:  01/03/23 125 lb 3.2  oz (56.8 kg)  09/20/22 128 lb (58.1 kg)  09/21/21 123 lb (55.8 kg)   Body mass index is 22.9 kg/m.    Physical Exam Constitutional:      General: She is not in acute distress.    Appearance: Normal appearance.  HENT:     Head: Normocephalic and atraumatic.  Eyes:     Conjunctiva/sclera: Conjunctivae normal.  Cardiovascular:     Rate and Rhythm: Normal rate and regular rhythm.     Heart sounds: Normal heart sounds.  Pulmonary:     Effort: Pulmonary effort is normal. No respiratory distress.     Breath sounds: Normal breath sounds. No wheezing.  Musculoskeletal:     Cervical back: Neck supple.     Right lower leg: No edema.     Left lower leg: No edema.  Lymphadenopathy:     Cervical: No cervical adenopathy.  Skin:    General: Skin is warm and dry.     Findings: No erythema or rash.  Neurological:     Mental Status: She is alert. Mental status is at baseline.  Psychiatric:        Mood and Affect: Mood normal.        Behavior: Behavior normal.        Lab Results  Component Value Date   WBC 10.5 09/20/2022   HGB 12.9 09/20/2022  HCT 40.6 09/20/2022   PLT 409.0 (H) 09/20/2022   GLUCOSE 523 (HH) 09/20/2022   CHOL 214 (H) 09/20/2022   TRIG 275.0 (H) 09/20/2022   HDL 59.70 09/20/2022   LDLDIRECT 120.0 09/20/2022   LDLCALC 102 (H) 09/01/2020   ALT 34 09/20/2022   AST 25 09/20/2022   NA 132 (L) 09/20/2022   K 4.4 09/20/2022   CL 94 (L) 09/20/2022   CREATININE 0.63 09/20/2022   BUN 10 09/20/2022   CO2 30 09/20/2022   TSH 1.42 01/23/2016   HGBA1C 13.5 (A) 09/20/2022   MICROALBUR 5.9 (H) 09/21/2021     Assessment & Plan:    See Problem List for Assessment and Plan of chronic medical problems.

## 2023-01-02 NOTE — Patient Instructions (Addendum)
     Try over the counter benadryl ointment or cortisone 1% cream for the itching on the toe.     Blood work was ordered.   The lab is on the first floor.    Medications changes include :   stop trulicity.  Start farxiga 10 mg daily      Return in about 3 months (around 04/04/2023) for follow up.

## 2023-01-03 ENCOUNTER — Ambulatory Visit: Payer: Medicaid Other | Admitting: Internal Medicine

## 2023-01-03 VITALS — BP 114/74 | HR 100 | Temp 98.5°F | Ht 62.0 in | Wt 125.2 lb

## 2023-01-03 DIAGNOSIS — E11319 Type 2 diabetes mellitus with unspecified diabetic retinopathy without macular edema: Secondary | ICD-10-CM | POA: Diagnosis not present

## 2023-01-03 DIAGNOSIS — Z794 Long term (current) use of insulin: Secondary | ICD-10-CM | POA: Diagnosis not present

## 2023-01-03 DIAGNOSIS — E782 Mixed hyperlipidemia: Secondary | ICD-10-CM

## 2023-01-03 LAB — LIPID PANEL
Cholesterol: 131 mg/dL (ref 0–200)
HDL: 50.3 mg/dL (ref >39.00)
LDL Cholesterol: 45 mg/dL (ref 0–99)
NonHDL: 81.04
Total CHOL/HDL Ratio: 3
Triglycerides: 180 mg/dL — ABNORMAL HIGH (ref 0.0–149.0)
VLDL: 36 mg/dL (ref 0.0–40.0)

## 2023-01-03 LAB — COMPREHENSIVE METABOLIC PANEL
ALT: 20 U/L (ref 0–35)
AST: 17 U/L (ref 0–37)
Albumin: 4.3 g/dL (ref 3.5–5.2)
Alkaline Phosphatase: 69 U/L (ref 39–117)
BUN: 9 mg/dL (ref 6–23)
CO2: 28 mEq/L (ref 19–32)
Calcium: 9.6 mg/dL (ref 8.4–10.5)
Chloride: 101 mEq/L (ref 96–112)
Creatinine, Ser: 0.6 mg/dL (ref 0.40–1.20)
GFR: 110.63 mL/min (ref >60.00)
Glucose, Bld: 264 mg/dL — ABNORMAL HIGH (ref 70–99)
Potassium: 4.2 mEq/L (ref 3.5–5.1)
Sodium: 135 mEq/L (ref 135–145)
Total Bilirubin: 0.4 mg/dL (ref 0.2–1.2)
Total Protein: 8 g/dL (ref 6.0–8.3)

## 2023-01-03 LAB — MICROALBUMIN / CREATININE URINE RATIO
Creatinine,U: 87.8 mg/dL
Microalb Creat Ratio: 34.1 mg/g — ABNORMAL HIGH (ref 0.0–30.0)
Microalb, Ur: 29.9 mg/dL — ABNORMAL HIGH (ref 0.0–1.9)

## 2023-01-03 LAB — HEMOGLOBIN A1C: Hgb A1c MFr Bld: 11.4 % — ABNORMAL HIGH (ref 4.6–6.5)

## 2023-01-03 MED ORDER — DAPAGLIFLOZIN PROPANEDIOL 10 MG PO TABS: 10.0000 mg | ORAL_TABLET | Freq: Every day | ORAL | 5 refills | Status: DC

## 2023-01-03 MED ORDER — DAPAGLIFLOZIN PROPANEDIOL 10 MG PO TABS: 10.0000 mg | ORAL_TABLET | Freq: Every day | ORAL | 2 refills | Status: DC

## 2023-01-03 MED ORDER — METFORMIN HCL 500 MG PO TABS: 500.0000 mg | ORAL_TABLET | Freq: Two times a day (BID) | ORAL | 2 refills | Status: DC

## 2023-01-03 MED ORDER — ATORVASTATIN CALCIUM 10 MG PO TABS: 10.0000 mg | ORAL_TABLET | Freq: Every day | ORAL | 3 refills | Status: DC

## 2023-01-03 NOTE — Assessment & Plan Note (Signed)
Chronic Secondary to diabetes Regular exercise and healthy diet encouraged Check lipid panel, CMP Continue atorvastatin 10 mg daily

## 2023-01-03 NOTE — Assessment & Plan Note (Signed)
Chronic   Lab Results  Component Value Date   HGBA1C 13.5 (A) 09/20/2022   Sugars very uncontrolled with blood work, but have improved with addition of Trulicity Sugars at home per sister have been well-controlled Check A1c, urine microalbumin Continue metformin 500 mg twice daily Discontinue Trulicity secondary to constipation, abdominal bloating, decreased appetite Weight loss Start Farxiga 10 mg daily Follow-up in 3 months

## 2023-01-16 ENCOUNTER — Other Ambulatory Visit (HOSPITAL_COMMUNITY): Payer: Self-pay

## 2023-01-16 ENCOUNTER — Telehealth: Payer: Self-pay

## 2023-01-16 NOTE — Telephone Encounter (Signed)
PA request received via CMM for Dapagliflozin Propanediol 10MG  tablets  PA has been submitted to [facility] Medicaid and is pending determination  Key: [id number]

## 2023-01-22 NOTE — Telephone Encounter (Signed)
Patient Advocate Encounter  Received a fax from [facility] [place] regarding Prior Authorization for  Dapagliflozin Propanediol 10MG  tablets .    Authorization has been DENIED due to

## 2023-01-24 ENCOUNTER — Telehealth: Payer: Self-pay | Admitting: Internal Medicine

## 2023-01-24 ENCOUNTER — Other Ambulatory Visit: Payer: Self-pay | Admitting: Internal Medicine

## 2023-01-24 NOTE — Telephone Encounter (Signed)
Patient wants to know if you can send her Trulicity in to be filled until her new medication is approved.  Please send to [facility] on [address], [place]

## 2023-01-25 MED ORDER — TRULICITY 0.75 MG/0.5ML ~~LOC~~ SOAJ: SUBCUTANEOUS | 0 refills | Status: DC

## 2023-01-25 NOTE — Telephone Encounter (Signed)
sent 

## 2023-01-28 ENCOUNTER — Other Ambulatory Visit: Payer: Self-pay

## 2023-01-28 MED ORDER — TRULICITY 0.75 MG/0.5ML ~~LOC~~ SOAJ: SUBCUTANEOUS | 0 refills | Status: DC

## 2023-01-28 NOTE — Telephone Encounter (Signed)
Pharmacy states that trulicity was not received for pharmacy - patient also wants to know if her prior authorization on the generic brand of farxiga has gone through.  Please call:  [phone]

## 2023-01-29 NOTE — Telephone Encounter (Signed)
Trulicity refaxed yesterday.  Marcelline Deist not covered.

## 2023-01-31 MED ORDER — EMPAGLIFLOZIN 25 MG PO TABS
25.0000 mg | ORAL_TABLET | Freq: Every day | ORAL | 3 refills | Status: DC
Start: 2023-01-31 — End: 2023-05-23

## 2023-01-31 NOTE — Telephone Encounter (Signed)
[facility] reviewed your DAPAGLIFLOZIN 10 MG TABLET request for the above-identified  member, and it is denied for the following reason: because we did not see what we need to  approve the drug you asked for, (dapagliflozin 10 milligram tablet). We may be able to approve  this drug in certain situations (when you have tried and failed or experienced an insufficient  response to at least two preferred products, such as Invokana, Jardiance, Malo, brand  Farxiga; (prior authorization may be required for all) or when you have a clinical reason that  preferred products cannot be tried). We do not see that this applies to you. We based this  decision on your health plan's prior authorization clinical criteria named SGLT2 Inhibitors and  Combinations. If you have any questions, contact the [facility] Pharm[facility] Department at [phone].  If you would like to speak to the clinician/physician who made this decision, please call  [phone] and request a peer-to-peer review.

## 2023-01-31 NOTE — Telephone Encounter (Signed)
Attempted to reach patient today but VM has not been set up and I am unable to leave message.

## 2023-01-31 NOTE — Telephone Encounter (Signed)
Lets try Jardiance 25 mg daily.  Let them know this is the same exact type of medication and hopefully will be covered.  Prescription sent to pharmacy.

## 2023-01-31 NOTE — Addendum Note (Signed)
Addended by: Pincus Sanes on: 01/31/2023 11:48 AM   Modules accepted: Orders

## 2023-02-04 ENCOUNTER — Other Ambulatory Visit: Payer: Self-pay | Admitting: Internal Medicine

## 2023-02-04 NOTE — Telephone Encounter (Signed)
Trulicity has been picked up from pharmacy by patient.

## 2023-02-04 NOTE — Telephone Encounter (Signed)
Patient states that the trulicity has still not been received - please resend

## 2023-02-07 ENCOUNTER — Other Ambulatory Visit (HOSPITAL_COMMUNITY): Payer: Self-pay

## 2023-02-10 ENCOUNTER — Telehealth: Payer: Self-pay

## 2023-02-10 ENCOUNTER — Other Ambulatory Visit (HOSPITAL_COMMUNITY): Payer: Self-pay

## 2023-02-10 NOTE — Telephone Encounter (Signed)
Patient Advocate Encounter  Prior authorization for Jardiance 25MG  tablets submitted and APPROVED through [employer] Medicaid.  Test billing returns $4.00 copay for 90 day supply.  Key [id number] Effective: 02-10-2023 - 02-09-2024

## 2023-04-12 ENCOUNTER — Other Ambulatory Visit: Payer: Self-pay | Admitting: Internal Medicine

## 2023-04-17 ENCOUNTER — Encounter: Payer: Self-pay | Admitting: Internal Medicine

## 2023-04-17 NOTE — Patient Instructions (Addendum)
      Blood work was ordered.   The lab is on the first floor.    Medications changes include :       A referral was ordered and someone will call you to schedule an appointment.     Return in about 3 months (around 07/19/2023) for follow up.

## 2023-04-17 NOTE — Progress Notes (Signed)
      Subjective:    Patient ID: [name], female    DOB: [birth year], 42 y.o.   MRN: [id number]     HPI [name] is here for follow up of her chronic medical problems.    Medications and allergies reviewed with patient and updated if appropriate.  Current Outpatient Medications on File Prior to Visit  Medication Sig Dispense Refill  . blood glucose meter kit and supplies KIT Dispense based on patient and insurance preference. Use up to four times daily as directed. (FOR E11.9). 1 each 0   No current facility-administered medications on file prior to visit.     Review of Systems     Objective:  There were no vitals filed for this visit. BP Readings from Last 3 Encounters:  05/23/23 112/70  01/03/23 114/74  09/20/22 124/78   Wt Readings from Last 3 Encounters:  05/23/23 125 lb (56.7 kg)  01/03/23 125 lb 3.2 oz (56.8 kg)  09/20/22 128 lb (58.1 kg)   There is no height or weight on file to calculate BMI.    Physical Exam     Lab Results  Component Value Date   WBC 10.5 09/20/2022   HGB 12.9 09/20/2022   HCT 40.6 09/20/2022   PLT 409.0 (H) 09/20/2022   GLUCOSE 264 (H) 01/03/2023   CHOL 131 01/03/2023   TRIG 180.0 (H) 01/03/2023   HDL 50.30 01/03/2023   LDLDIRECT 120.0 09/20/2022   LDLCALC 45 01/03/2023   ALT 20 01/03/2023   AST 17 01/03/2023   NA 135 01/03/2023   K 4.2 01/03/2023   CL 101 01/03/2023   CREATININE 0.60 01/03/2023   BUN 9 01/03/2023   CO2 28 01/03/2023   TSH 1.42 01/23/2016   HGBA1C 11.4 (H) 01/03/2023   MICROALBUR 29.9 (H) 01/03/2023     Assessment & Plan:    See Problem List for Assessment and Plan of chronic medical problems.    This encounter was created in error - please disregard.

## 2023-04-18 ENCOUNTER — Encounter: Payer: Medicaid Other | Admitting: Internal Medicine

## 2023-04-18 DIAGNOSIS — E11319 Type 2 diabetes mellitus with unspecified diabetic retinopathy without macular edema: Secondary | ICD-10-CM

## 2023-04-18 DIAGNOSIS — E782 Mixed hyperlipidemia: Secondary | ICD-10-CM

## 2023-04-18 NOTE — Assessment & Plan Note (Signed)
Chronic Secondary to diabetes Regular exercise and healthy diet encouraged Check lipid panel, CMP Continue atorvastatin 10 mg daily

## 2023-04-18 NOTE — Assessment & Plan Note (Addendum)
Chronic With hyperglycemia, hyperlipidemia  Lab Results  Component Value Date   HGBA1C 11.4 (H) 01/03/2023   Sugars uncontrolled Checking sugars at home Check A1c Continue metformin 500 mg twice daily-does not tolerate a higher dose Continue Jardiance 25 mg daily Continue Trulicity-currently taking 0.75 mg weekly  Follow-up in 3 months

## 2023-05-08 ENCOUNTER — Encounter: Payer: Self-pay | Admitting: Internal Medicine

## 2023-05-08 NOTE — Progress Notes (Signed)
      Subjective:    Patient ID: [name], female    DOB: [birth year], 42 y.o.   MRN: [id number]     HPI [name] is here for follow up of her chronic medical problems.    Medications and allergies reviewed with patient and updated if appropriate.  Current Outpatient Medications on File Prior to Visit  Medication Sig Dispense Refill  . blood glucose meter kit and supplies KIT Dispense based on patient and insurance preference. Use up to four times daily as directed. (FOR E11.9). 1 each 0   No current facility-administered medications on file prior to visit.     Review of Systems     Objective:  There were no vitals filed for this visit. BP Readings from Last 3 Encounters:  05/23/23 112/70  01/03/23 114/74  09/20/22 124/78   Wt Readings from Last 3 Encounters:  05/23/23 125 lb (56.7 kg)  01/03/23 125 lb 3.2 oz (56.8 kg)  09/20/22 128 lb (58.1 kg)   There is no height or weight on file to calculate BMI.    Physical Exam     Lab Results  Component Value Date   WBC 10.5 09/20/2022   HGB 12.9 09/20/2022   HCT 40.6 09/20/2022   PLT 409.0 (H) 09/20/2022   GLUCOSE 264 (H) 01/03/2023   CHOL 131 01/03/2023   TRIG 180.0 (H) 01/03/2023   HDL 50.30 01/03/2023   LDLDIRECT 120.0 09/20/2022   LDLCALC 45 01/03/2023   ALT 20 01/03/2023   AST 17 01/03/2023   NA 135 01/03/2023   K 4.2 01/03/2023   CL 101 01/03/2023   CREATININE 0.60 01/03/2023   BUN 9 01/03/2023   CO2 28 01/03/2023   TSH 1.42 01/23/2016   HGBA1C 11.4 (H) 01/03/2023   MICROALBUR 29.9 (H) 01/03/2023     Assessment & Plan:    See Problem List for Assessment and Plan of chronic medical problems.    This encounter was created in error - please disregard.

## 2023-05-08 NOTE — Patient Instructions (Addendum)
        Medications changes include :       A referral was ordered and someone will call you to schedule an appointment.     Return in about 3 months (around 08/09/2023) for follow up.

## 2023-05-09 ENCOUNTER — Encounter: Payer: Medicaid Other | Admitting: Internal Medicine

## 2023-05-09 DIAGNOSIS — E11319 Type 2 diabetes mellitus with unspecified diabetic retinopathy without macular edema: Secondary | ICD-10-CM

## 2023-05-09 DIAGNOSIS — E782 Mixed hyperlipidemia: Secondary | ICD-10-CM

## 2023-05-23 ENCOUNTER — Ambulatory Visit: Payer: Medicaid Other | Admitting: Internal Medicine

## 2023-05-23 ENCOUNTER — Encounter: Payer: Self-pay | Admitting: Internal Medicine

## 2023-05-23 VITALS — BP 112/70 | HR 95 | Temp 98.4°F | Ht 62.0 in | Wt 125.0 lb

## 2023-05-23 DIAGNOSIS — Z794 Long term (current) use of insulin: Secondary | ICD-10-CM

## 2023-05-23 DIAGNOSIS — E782 Mixed hyperlipidemia: Secondary | ICD-10-CM | POA: Diagnosis not present

## 2023-05-23 DIAGNOSIS — E11319 Type 2 diabetes mellitus with unspecified diabetic retinopathy without macular edema: Secondary | ICD-10-CM

## 2023-05-23 MED ORDER — EMPAGLIFLOZIN 25 MG PO TABS: 25.0000 mg | ORAL_TABLET | Freq: Every day | ORAL | 3 refills | Status: DC

## 2023-05-23 MED ORDER — METFORMIN HCL 500 MG PO TABS: 500.0000 mg | ORAL_TABLET | Freq: Two times a day (BID) | ORAL | 2 refills | Status: DC

## 2023-05-23 MED ORDER — TRULICITY 0.75 MG/0.5ML ~~LOC~~ SOAJ: SUBCUTANEOUS | 0 refills | Status: DC

## 2023-05-23 MED ORDER — ATORVASTATIN CALCIUM 10 MG PO TABS: 10.0000 mg | ORAL_TABLET | Freq: Every day | ORAL | 3 refills | Status: DC

## 2023-05-23 NOTE — Assessment & Plan Note (Signed)
Chronic Secondary to diabetes LDL has been controlled Continue atorvastatin 10 mg daily

## 2023-05-23 NOTE — Assessment & Plan Note (Signed)
Chronic With hyperglycemia, hyperlipidemia  Lab Results  Component Value Date   HGBA1C 11.4 (H) 01/03/2023   Sugars uncontrolled She has been taking metformin, but did not realize Jardiance had been approved by her insurance and only took the Trulicity for 1 month and never called to get a refill from [place] No point in checking A1c at this time Continue metformin 500 mg twice daily-does not tolerate a higher dose Start Jardiance 25 mg daily Restart Trulicity 0.75 mg weekly-stressed her son that she needs to let us know after the first month so that we can send a new prescription for the next dose  Follow-up in 3 months

## 2023-05-23 NOTE — Progress Notes (Signed)
Subjective:    Patient ID: [name], female    DOB: 1980/11/19, 42 y.o.   MRN: [id number]     HPI [name] is here for follow up of her chronic medical problems.  She is here with her son.  Taking atorvastatin and metformin.  She states she did take Trulicity, but after the first month she called the pharmacy for refill and they advised her to contact her PCP but that was not done.  They did not realize the Jardiance had been approved by her insurance so she has not been taking that as well.  No concerns  Medications and allergies reviewed with patient and updated if appropriate.  Current Outpatient Medications on File Prior to Visit  Medication Sig Dispense Refill   blood glucose meter kit and supplies KIT Dispense based on patient and insurance preference. Use up to four times daily as directed. (FOR E11.9). 1 each 0   empagliflozin (JARDIANCE) 25 MG TABS tablet Take 1 tablet (25 mg total) by mouth daily before breakfast. 90 tablet 3   TRULICITY 0.75 MG/0.5ML SOPN ADMINISTER 0.75 MG UNDER THE SKIN 1 TIME A WEEK 2 mL 0   No current facility-administered medications on file prior to visit.     Review of Systems  Constitutional:  Negative for fever.  Respiratory:  Negative for cough, shortness of breath and wheezing.   Cardiovascular:  Negative for chest pain, palpitations and leg swelling.  Neurological:  Negative for light-headedness and headaches.       Objective:   Vitals:   05/23/23 1512  BP: 112/70  Pulse: 95  Temp: 98.4 F (36.9 C)  SpO2: 98%   BP Readings from Last 3 Encounters:  05/23/23 112/70  01/03/23 114/74  09/20/22 124/78   Wt Readings from Last 3 Encounters:  05/23/23 125 lb (56.7 kg)  01/03/23 125 lb 3.2 oz (56.8 kg)  09/20/22 128 lb (58.1 kg)   Body mass index is 22.86 kg/m.    Physical Exam Constitutional:      General: She is not in acute distress.    Appearance: Normal appearance.  HENT:     Head: Normocephalic and  atraumatic.  Eyes:     Conjunctiva/sclera: Conjunctivae normal.  Cardiovascular:     Rate and Rhythm: Normal rate and regular rhythm.     Heart sounds: Normal heart sounds.  Pulmonary:     Effort: Pulmonary effort is normal. No respiratory distress.     Breath sounds: Normal breath sounds. No wheezing.  Musculoskeletal:     Cervical back: Neck supple.     Right lower leg: No edema.     Left lower leg: No edema.  Lymphadenopathy:     Cervical: No cervical adenopathy.  Skin:    General: Skin is warm and dry.     Findings: No rash.  Neurological:     Mental Status: She is alert. Mental status is at baseline.  Psychiatric:        Mood and Affect: Mood normal.        Behavior: Behavior normal.        Lab Results  Component Value Date   WBC 10.5 09/20/2022   HGB 12.9 09/20/2022   HCT 40.6 09/20/2022   PLT 409.0 (H) 09/20/2022   GLUCOSE 264 (H) 01/03/2023   CHOL 131 01/03/2023   TRIG 180.0 (H) 01/03/2023   HDL 50.30 01/03/2023   LDLDIRECT 120.0 09/20/2022   LDLCALC 45 01/03/2023   ALT 20 01/03/2023  AST 17 01/03/2023   NA 135 01/03/2023   K 4.2 01/03/2023   CL 101 01/03/2023   CREATININE 0.60 01/03/2023   BUN 9 01/03/2023   CO2 28 01/03/2023   TSH 1.42 01/23/2016   HGBA1C 11.4 (H) 01/03/2023   MICROALBUR 29.9 (H) 01/03/2023     Assessment & Plan:    See Problem List for Assessment and Plan of chronic medical problems.

## 2023-05-23 NOTE — Patient Instructions (Addendum)
      Medications changes include :   restart trulicity injection weekly - we need to increase this dose after the first month.   Start jardiance 25 mg daily.   Continue your current medications.     Return in about 3 months (around 08/22/2023) for follow up.

## 2023-05-25 ENCOUNTER — Other Ambulatory Visit: Payer: Self-pay | Admitting: Internal Medicine

## 2023-07-23 ENCOUNTER — Telehealth: Payer: Self-pay | Admitting: Internal Medicine

## 2023-07-23 ENCOUNTER — Other Ambulatory Visit: Payer: Self-pay

## 2023-07-23 DIAGNOSIS — E11319 Type 2 diabetes mellitus with unspecified diabetic retinopathy without macular edema: Secondary | ICD-10-CM

## 2023-07-23 MED ORDER — DULAGLUTIDE 1.5 MG/0.5ML ~~LOC~~ SOAJ: 1.5000 mg | SUBCUTANEOUS | 2 refills | Status: DC

## 2023-07-23 NOTE — Telephone Encounter (Signed)
Updated dose sent in

## 2023-07-23 NOTE — Telephone Encounter (Signed)
Prescription Request  07/23/2023  LOV: 05/23/2023  What is the name of the medication or equipment? Dulaglutide (TRULICITY) 0.75 MG/0.5ML SOPN   Have you contacted your pharmacy to request a refill? Yes   Which pharmacy would you like this sent to?  [id number] - [place], [place] - [address] AT [facility] [address] [place] [address] Phone: [phone] Fax: [phone]    Patient notified that their request is being sent to the clinical staff for review and that they should receive a response within 2 business days.   Please advise at Mobile [phone] (mobile)

## 2023-07-24 ENCOUNTER — Other Ambulatory Visit: Payer: Self-pay | Admitting: Internal Medicine

## 2023-08-21 ENCOUNTER — Encounter: Payer: Self-pay | Admitting: Internal Medicine

## 2023-08-21 NOTE — Patient Instructions (Incomplete)
      Blood work was ordered.       Medications changes include :   decrease lasix to 20 mg daily and decrease potassium to 20 mg daily   A referral was ordered for the heart failure clinic.      Return in about 2 months (around 10/05/2023) for follow up.

## 2023-08-21 NOTE — Progress Notes (Unsigned)
      Subjective:    Patient ID: [name], female    DOB: 18-Oct-1980, 42 y.o.   MRN: [id number]     HPI [name] is here for follow up of her chronic medical problems.    Medications and allergies reviewed with patient and updated if appropriate.  Current Outpatient Medications on File Prior to Visit  Medication Sig Dispense Refill   atorvastatin (LIPITOR) 10 MG tablet Take 1 tablet (10 mg total) by mouth daily. 90 tablet 3   blood glucose meter kit and supplies KIT Dispense based on patient and insurance preference. Use up to four times daily as directed. (FOR E11.9). 1 each 0   Dulaglutide (TRULICITY) 0.75 MG/0.5ML SOPN ADMINISTER 0.75 MG UNDER THE SKIN 1 TIME A WEEK 2 mL 0   Dulaglutide 1.5 MG/0.5ML SOAJ Inject 1.5 mg into the skin once a week. 2 mL 2   empagliflozin (JARDIANCE) 25 MG TABS tablet Take 1 tablet (25 mg total) by mouth daily before breakfast. 90 tablet 3   metFORMIN (GLUCOPHAGE) 500 MG tablet TAKE 1 TABLET(500 MG) BY MOUTH TWICE DAILY WITH A MEAL 180 tablet 2   No current facility-administered medications on file prior to visit.     Review of Systems     Objective:  There were no vitals filed for this visit. BP Readings from Last 3 Encounters:  05/23/23 112/70  01/03/23 114/74  09/20/22 124/78   Wt Readings from Last 3 Encounters:  05/23/23 125 lb (56.7 kg)  01/03/23 125 lb 3.2 oz (56.8 kg)  09/20/22 128 lb (58.1 kg)   There is no height or weight on file to calculate BMI.    Physical Exam     Lab Results  Component Value Date   WBC 10.5 09/20/2022   HGB 12.9 09/20/2022   HCT 40.6 09/20/2022   PLT 409.0 (H) 09/20/2022   GLUCOSE 264 (H) 01/03/2023   CHOL 131 01/03/2023   TRIG 180.0 (H) 01/03/2023   HDL 50.30 01/03/2023   LDLDIRECT 120.0 09/20/2022   LDLCALC 45 01/03/2023   ALT 20 01/03/2023   AST 17 01/03/2023   NA 135 01/03/2023   K 4.2 01/03/2023   CL 101 01/03/2023   CREATININE 0.60 01/03/2023   BUN 9 01/03/2023   CO2 28  01/03/2023   TSH 1.42 01/23/2016   HGBA1C 11.4 (H) 01/03/2023   MICROALBUR 29.9 (H) 01/03/2023     Assessment & Plan:    See Problem List for Assessment and Plan of chronic medical problems.

## 2023-08-22 ENCOUNTER — Ambulatory Visit: Payer: Medicaid Other | Admitting: Internal Medicine

## 2023-08-22 DIAGNOSIS — E11319 Type 2 diabetes mellitus with unspecified diabetic retinopathy without macular edema: Secondary | ICD-10-CM

## 2023-08-22 DIAGNOSIS — E782 Mixed hyperlipidemia: Secondary | ICD-10-CM

## 2023-11-01 ENCOUNTER — Other Ambulatory Visit: Payer: Self-pay | Admitting: Internal Medicine

## 2023-11-11 ENCOUNTER — Other Ambulatory Visit: Payer: Self-pay | Admitting: Internal Medicine

## 2023-11-11 DIAGNOSIS — E11319 Type 2 diabetes mellitus with unspecified diabetic retinopathy without macular edema: Secondary | ICD-10-CM

## 2023-11-11 NOTE — Telephone Encounter (Signed)
 Copied from CRM [id number]. Topic: Clinical - Medication Refill >> Nov 11, 2023  5:04 PM Corin V wrote: Most Recent Primary Care Visit:  Provider: BURNS, Bobette Mo  Department: [facility]  Visit Type: OFFICE VISIT  Date: 05/23/2023  Medication: metFORMIN (GLUCOPHAGE) 500 MG tablet Dulaglutide 1.5 MG/0.5ML SOAJ atorvastatin (LIPITOR) 10 MG tablet empagliflozin (JARDIANCE) 25 MG TABS tablet  Has the patient contacted their pharmacy? Yes (Agent: If no, request that the patient contact the pharmacy for the refill. If patient does not wish to contact the pharmacy document the reason why and proceed with request.) (Agent: If yes, when and what did the pharmacy advise?)  Is this the correct pharmacy for this prescription? Yes If no, delete pharmacy and type the correct one.  This is the patient's preferred pharmacy:  [id number] - [place] Otto, [place] - [address] AT [facility] OF [address] [place] [address] Phone: [phone] Fax: [phone]  Has the prescription been filled recently? No  Is the patient out of the medication? No  Has the patient been seen for an appointment in the last year OR does the patient have an upcoming appointment? Yes  Can we respond through MyChart? No  Agent: Please be advised that Rx refills may take up to 3 business days. We ask that you follow-up with your pharmacy.

## 2023-11-11 NOTE — Telephone Encounter (Signed)
 Last Fill: Metformin: 07/25/23     Dulaglutide: 07/23/23     Atorvastatin: 05/23/23     Jardiance: 05/23/23  Last OV: 05/23/23 Next OV: 11/28/23  Routing to provider for review/authorization.

## 2023-11-13 MED ORDER — DULAGLUTIDE 1.5 MG/0.5ML ~~LOC~~ SOAJ: 1.5000 mg | SUBCUTANEOUS | 2 refills | Status: DC

## 2023-11-13 MED ORDER — EMPAGLIFLOZIN 25 MG PO TABS: 25.0000 mg | ORAL_TABLET | Freq: Every day | ORAL | 3 refills | Status: DC

## 2023-11-13 MED ORDER — METFORMIN HCL 500 MG PO TABS: ORAL_TABLET | ORAL | 2 refills | Status: DC

## 2023-11-13 MED ORDER — ATORVASTATIN CALCIUM 10 MG PO TABS: 10.0000 mg | ORAL_TABLET | Freq: Every day | ORAL | 3 refills | Status: DC

## 2023-11-27 ENCOUNTER — Encounter: Payer: Self-pay | Admitting: Internal Medicine

## 2023-11-27 DIAGNOSIS — R809 Proteinuria, unspecified: Secondary | ICD-10-CM | POA: Insufficient documentation

## 2023-11-27 NOTE — Patient Instructions (Addendum)
      Blood work was ordered.       Medications changes include :   None    A referral was ordered and someone will call you to schedule an appointment.     Return in about 4 months (around 03/29/2024) for follow up.

## 2023-11-27 NOTE — Progress Notes (Unsigned)
      Subjective:    Patient ID: [name], female    DOB: 1981/01/19, 43 y.o.   MRN: [id number]     HPI [name] is here for follow up of her chronic medical problems.    Medications and allergies reviewed with patient and updated if appropriate.  Current Outpatient Medications on File Prior to Visit  Medication Sig Dispense Refill   atorvastatin (LIPITOR) 10 MG tablet Take 1 tablet (10 mg total) by mouth daily. 90 tablet 3   blood glucose meter kit and supplies KIT Dispense based on patient and insurance preference. Use up to four times daily as directed. (FOR E11.9). 1 each 0   Dulaglutide 1.5 MG/0.5ML SOAJ Inject 1.5 mg into the skin once a week. 2 mL 2   empagliflozin (JARDIANCE) 25 MG TABS tablet Take 1 tablet (25 mg total) by mouth daily before breakfast. 90 tablet 3   metFORMIN (GLUCOPHAGE) 500 MG tablet TAKE 1 TABLET(500 MG) BY MOUTH TWICE DAILY WITH A MEAL 180 tablet 2   No current facility-administered medications on file prior to visit.     Review of Systems     Objective:  There were no vitals filed for this visit. BP Readings from Last 3 Encounters:  05/23/23 112/70  01/03/23 114/74  09/20/22 124/78   Wt Readings from Last 3 Encounters:  05/23/23 125 lb (56.7 kg)  01/03/23 125 lb 3.2 oz (56.8 kg)  09/20/22 128 lb (58.1 kg)   There is no height or weight on file to calculate BMI.    Physical Exam     Lab Results  Component Value Date   WBC 10.5 09/20/2022   HGB 12.9 09/20/2022   HCT 40.6 09/20/2022   PLT 409.0 (H) 09/20/2022   GLUCOSE 264 (H) 01/03/2023   CHOL 131 01/03/2023   TRIG 180.0 (H) 01/03/2023   HDL 50.30 01/03/2023   LDLDIRECT 120.0 09/20/2022   LDLCALC 45 01/03/2023   ALT 20 01/03/2023   AST 17 01/03/2023   NA 135 01/03/2023   K 4.2 01/03/2023   CL 101 01/03/2023   CREATININE 0.60 01/03/2023   BUN 9 01/03/2023   CO2 28 01/03/2023   TSH 1.42 01/23/2016   HGBA1C 11.4 (H) 01/03/2023   MICROALBUR 29.9 (H) 01/03/2023      Assessment & Plan:    See Problem List for Assessment and Plan of chronic medical problems.

## 2023-11-28 ENCOUNTER — Ambulatory Visit: Payer: Medicaid Other | Admitting: Internal Medicine

## 2023-11-28 VITALS — BP 108/72 | HR 80 | Temp 97.9°F | Ht 62.0 in | Wt 114.0 lb

## 2023-11-28 DIAGNOSIS — E782 Mixed hyperlipidemia: Secondary | ICD-10-CM

## 2023-11-28 DIAGNOSIS — Z794 Long term (current) use of insulin: Secondary | ICD-10-CM | POA: Diagnosis not present

## 2023-11-28 DIAGNOSIS — R809 Proteinuria, unspecified: Secondary | ICD-10-CM

## 2023-11-28 DIAGNOSIS — E11319 Type 2 diabetes mellitus with unspecified diabetic retinopathy without macular edema: Secondary | ICD-10-CM | POA: Diagnosis not present

## 2023-11-28 LAB — COMPREHENSIVE METABOLIC PANEL
ALT: 23 U/L (ref 0–35)
AST: 21 U/L (ref 0–37)
Albumin: 4.4 g/dL (ref 3.5–5.2)
Alkaline Phosphatase: 87 U/L (ref 39–117)
BUN: 13 mg/dL (ref 6–23)
CO2: 26 meq/L (ref 19–32)
Calcium: 10 mg/dL (ref 8.4–10.5)
Chloride: 102 meq/L (ref 96–112)
Creatinine, Ser: 0.59 mg/dL (ref 0.40–1.20)
GFR: 110.38 mL/min (ref >60.00)
Glucose, Bld: 196 mg/dL — ABNORMAL HIGH (ref 70–99)
Potassium: 4.1 meq/L (ref 3.5–5.1)
Sodium: 137 meq/L (ref 135–145)
Total Bilirubin: 0.5 mg/dL (ref 0.2–1.2)
Total Protein: 8.5 g/dL — ABNORMAL HIGH (ref 6.0–8.3)

## 2023-11-28 LAB — LIPID PANEL
Cholesterol: 174 mg/dL (ref 0–200)
HDL: 62.5 mg/dL (ref >39.00)
LDL Cholesterol: 85 mg/dL (ref 0–99)
NonHDL: 111.21
Total CHOL/HDL Ratio: 3
Triglycerides: 133 mg/dL (ref 0.0–149.0)
VLDL: 26.6 mg/dL (ref 0.0–40.0)

## 2023-11-28 LAB — MICROALBUMIN / CREATININE URINE RATIO
Creatinine,U: 55.9 mg/dL
Microalb Creat Ratio: 78 mg/g — ABNORMAL HIGH (ref 0.0–30.0)
Microalb, Ur: 4.4 mg/dL — ABNORMAL HIGH (ref 0.0–1.9)

## 2023-11-28 LAB — HEMOGLOBIN A1C: Hgb A1c MFr Bld: 10.4 % — ABNORMAL HIGH (ref 4.6–6.5)

## 2023-11-28 MED ORDER — TRULICITY 3 MG/0.5ML ~~LOC~~ SOAJ: 3.0000 mg | SUBCUTANEOUS | 0 refills | Status: DC

## 2023-11-28 NOTE — Assessment & Plan Note (Signed)
 Chronic Uncontrolled Lab Results  Component Value Date   HGBA1C 10.4 (H) 11/28/2023   Sugar slightly better, but still way too high Has been out of metformin for 2 weeks and Trulicity for a month-had difficulty getting refills.  Discussed with sister signing up for MyChart so that we can make sure she does not run out of her medications Continue Trulicity 1.5 mg weekly-with next refill will increase to 3 mg weekly Continue Jardiance 25 mg daily and metformin 500 mg twice daily Encouraged her to check her sugars Will plan to increase Trulicity to 4.5 mg over the next couple of months Low sugar/carbohydrate diet Follow-up in 4 months

## 2023-11-28 NOTE — Assessment & Plan Note (Signed)
 New Last urine microalbumin and the 1 today showed an elevated microalbumin/creatinine ratio Continue Jardiance 25 mg daily Stressed to her and her sister the importance of getting her sugar better controlled-I hope to do this over the next few months and this may help show some improvement in the microalbumin Discussed with her that the other thing we can do is add low-dose lisinopril, but I am concerned that this will drop her blood pressure too much and she will not tolerate it

## 2023-11-28 NOTE — Assessment & Plan Note (Signed)
 Chronic Secondary to diabetes LDL has been controlled Lab Results  Component Value Date   LDLCALC 85 11/28/2023   Continue atorvastatin 10 mg daily

## 2024-04-02 ENCOUNTER — Ambulatory Visit: Admitting: Internal Medicine

## 2024-04-12 ENCOUNTER — Other Ambulatory Visit: Payer: Self-pay | Admitting: Internal Medicine

## 2024-04-12 DIAGNOSIS — E11319 Type 2 diabetes mellitus with unspecified diabetic retinopathy without macular edema: Secondary | ICD-10-CM

## 2024-04-12 NOTE — Patient Instructions (Addendum)
      Blood work was ordered.       Medications changes include :   None    A referral was ordered and someone will call you to schedule an appointment.     Return in about 3 months (around 07/14/2024) for follow up.

## 2024-04-12 NOTE — Progress Notes (Unsigned)
      Subjective:    Patient ID: [name], female    DOB: [birth year], 43 y.o.   MRN: [id number]     HPI [name] is here for follow up of her chronic medical problems.    Medications and allergies reviewed with patient and updated if appropriate.  Current Outpatient Medications on File Prior to Visit  Medication Sig Dispense Refill   atorvastatin  (LIPITOR) 10 MG tablet Take 1 tablet (10 mg total) by mouth daily. 90 tablet 3   blood glucose meter kit and supplies KIT Dispense based on patient and insurance preference. Use up to four times daily as directed. (FOR E11.9). 1 each 0   Dulaglutide  (TRULICITY ) 3 MG/0.5ML SOAJ Inject 3 mg as directed once a week. 2 mL 0   empagliflozin  (JARDIANCE ) 25 MG TABS tablet Take 1 tablet (25 mg total) by mouth daily before breakfast. 90 tablet 3   metFORMIN  (GLUCOPHAGE ) 500 MG tablet TAKE 1 TABLET(500 MG) BY MOUTH TWICE DAILY WITH A MEAL 180 tablet 2   No current facility-administered medications on file prior to visit.     Review of Systems     Objective:  There were no vitals filed for this visit. BP Readings from Last 3 Encounters:  11/28/23 108/72  05/23/23 112/70  01/03/23 114/74   Wt Readings from Last 3 Encounters:  11/28/23 114 lb (51.7 kg)  05/23/23 125 lb (56.7 kg)  01/03/23 125 lb 3.2 oz (56.8 kg)   There is no height or weight on file to calculate BMI.    Physical Exam     Lab Results  Component Value Date   WBC 10.5 09/20/2022   HGB 12.9 09/20/2022   HCT 40.6 09/20/2022   PLT 409.0 (H) 09/20/2022   GLUCOSE 196 (H) 11/28/2023   CHOL 174 11/28/2023   TRIG 133.0 11/28/2023   HDL 62.50 11/28/2023   LDLDIRECT 120.0 09/20/2022   LDLCALC 85 11/28/2023   ALT 23 11/28/2023   AST 21 11/28/2023   NA 137 11/28/2023   K 4.1 11/28/2023   CL 102 11/28/2023   CREATININE 0.59 11/28/2023   BUN 13 11/28/2023   CO2 26 11/28/2023   TSH 1.42 01/23/2016   HGBA1C 10.4 (H) 11/28/2023   MICROALBUR 4.4 (H) 11/28/2023      Assessment & Plan:    See Problem List for Assessment and Plan of chronic medical problems.

## 2024-04-13 ENCOUNTER — Ambulatory Visit: Admitting: Internal Medicine

## 2024-04-13 ENCOUNTER — Encounter: Payer: Self-pay | Admitting: Internal Medicine

## 2024-04-13 VITALS — BP 108/74 | HR 94 | Temp 98.4°F | Ht 62.0 in | Wt 117.0 lb

## 2024-04-13 DIAGNOSIS — E11319 Type 2 diabetes mellitus with unspecified diabetic retinopathy without macular edema: Secondary | ICD-10-CM | POA: Diagnosis not present

## 2024-04-13 DIAGNOSIS — R809 Proteinuria, unspecified: Secondary | ICD-10-CM

## 2024-04-13 DIAGNOSIS — Z794 Long term (current) use of insulin: Secondary | ICD-10-CM

## 2024-04-13 DIAGNOSIS — E782 Mixed hyperlipidemia: Secondary | ICD-10-CM | POA: Diagnosis not present

## 2024-04-13 LAB — COMPREHENSIVE METABOLIC PANEL WITH GFR
ALT: 13 U/L (ref 0–35)
AST: 13 U/L (ref 0–37)
Albumin: 4.3 g/dL (ref 3.5–5.2)
Alkaline Phosphatase: 56 U/L (ref 39–117)
BUN: 15 mg/dL (ref 6–23)
CO2: 26 meq/L (ref 19–32)
Calcium: 9.3 mg/dL (ref 8.4–10.5)
Chloride: 104 meq/L (ref 96–112)
Creatinine, Ser: 0.56 mg/dL (ref 0.40–1.20)
GFR: 111.49 mL/min (ref >60.00)
Glucose, Bld: 188 mg/dL — ABNORMAL HIGH (ref 70–99)
Potassium: 3.9 meq/L (ref 3.5–5.1)
Sodium: 138 meq/L (ref 135–145)
Total Bilirubin: 0.6 mg/dL (ref 0.2–1.2)
Total Protein: 7.7 g/dL (ref 6.0–8.3)

## 2024-04-13 LAB — CBC WITH DIFFERENTIAL/PLATELET
Basophils Absolute: 0.1 K/uL (ref 0.0–0.1)
Basophils Relative: 0.7 % (ref 0.0–3.0)
Eosinophils Absolute: 0 K/uL (ref 0.0–0.7)
Eosinophils Relative: 0.5 % (ref 0.0–5.0)
HCT: 31.2 % — ABNORMAL LOW (ref 36.0–46.0)
Hemoglobin: 9.6 g/dL — ABNORMAL LOW (ref 12.0–15.0)
Lymphocytes Relative: 36.8 % (ref 12.0–46.0)
Lymphs Abs: 3.5 K/uL (ref 0.7–4.0)
MCHC: 30.8 g/dL (ref 30.0–36.0)
MCV: 63.7 fl — ABNORMAL LOW (ref 78.0–100.0)
Monocytes Absolute: 0.6 K/uL (ref 0.1–1.0)
Monocytes Relative: 6.2 % (ref 3.0–12.0)
Neutro Abs: 5.3 K/uL (ref 1.4–7.7)
Neutrophils Relative %: 55.8 % (ref 43.0–77.0)
Platelets: 463 K/uL — ABNORMAL HIGH (ref 150.0–400.0)
RBC: 4.9 Mil/uL (ref 3.87–5.11)
RDW: 19 % — ABNORMAL HIGH (ref 11.5–15.5)
WBC: 9.5 K/uL (ref 4.0–10.5)

## 2024-04-13 LAB — LIPID PANEL
Cholesterol: 118 mg/dL (ref 0–200)
HDL: 52.2 mg/dL (ref >39.00)
LDL Cholesterol: 48 mg/dL (ref 0–99)
NonHDL: 65.37
Total CHOL/HDL Ratio: 2
Triglycerides: 88 mg/dL (ref 0.0–149.0)
VLDL: 17.6 mg/dL (ref 0.0–40.0)

## 2024-04-13 LAB — MICROALBUMIN / CREATININE URINE RATIO
Creatinine,U: 35.1 mg/dL
Microalb Creat Ratio: 182.7 mg/g — ABNORMAL HIGH (ref 0.0–30.0)
Microalb, Ur: 6.4 mg/dL — ABNORMAL HIGH (ref 0.0–1.9)

## 2024-04-13 LAB — HEMOGLOBIN A1C: Hgb A1c MFr Bld: 8.7 % — ABNORMAL HIGH (ref 4.6–6.5)

## 2024-04-13 MED ORDER — BLOOD GLUCOSE MONITORING SUPPL DEVI: 0 refills | Status: AC

## 2024-04-13 MED ORDER — TRULICITY 4.5 MG/0.5ML ~~LOC~~ SOAJ: 4.5000 mg | SUBCUTANEOUS | 3 refills | Status: DC

## 2024-04-13 MED ORDER — TRIAMCINOLONE ACETONIDE 0.025 % EX OINT: 1.0000 | TOPICAL_OINTMENT | Freq: Two times a day (BID) | CUTANEOUS | 1 refills | Status: AC | PRN

## 2024-04-13 MED ORDER — LANCETS MISC. MISC: 8 refills | Status: AC

## 2024-04-13 MED ORDER — METFORMIN HCL 500 MG PO TABS: ORAL_TABLET | ORAL | 2 refills | Status: DC

## 2024-04-13 MED ORDER — LANCET DEVICE MISC: 1.0000 | Freq: Three times a day (TID) | 0 refills | Status: AC

## 2024-04-13 MED ORDER — BLOOD GLUCOSE TEST VI STRP: ORAL_STRIP | 5 refills | Status: AC

## 2024-04-13 NOTE — Assessment & Plan Note (Signed)
 Chronic Uncontrolled Lab Results  Component Value Date   HGBA1C 10.4 (H) 11/28/2023   Check A1c, urine albumin/cr ratio Continue Trulicity  but increase to 4.5 mg weekly Continue Jardiance  25 mg daily and metformin  500 mg twice daily Encouraged her to check her sugars - new glucometer sent to pharmacy Low sugar/carbohydrate diet

## 2024-04-13 NOTE — Assessment & Plan Note (Signed)
 Chronic Working on getting sugars better controlled Will recheck urine albumin/cr ratio if she is able to give a sample today

## 2024-04-13 NOTE — Assessment & Plan Note (Signed)
 Chronic Secondary to diabetes LDL has been controlled Lab Results  Component Value Date   LDLCALC 85 11/28/2023   Check lipids Continue atorvastatin  10 mg daily

## 2024-04-15 ENCOUNTER — Ambulatory Visit: Payer: Self-pay | Admitting: Internal Medicine

## 2024-04-15 DIAGNOSIS — D649 Anemia, unspecified: Secondary | ICD-10-CM

## 2024-08-15 ENCOUNTER — Encounter: Payer: Self-pay | Admitting: Internal Medicine

## 2024-08-15 NOTE — Progress Notes (Unsigned)
      Subjective:    Patient ID: [name], female    DOB: 1981/06/01, 43 y.o.   MRN: [id number]     HPI [name] is here for follow up of her chronic medical problems.    Medications and allergies reviewed with patient and updated if appropriate.  Current Outpatient Medications on File Prior to Visit  Medication Sig Dispense Refill   atorvastatin  (LIPITOR) 10 MG tablet Take 1 tablet (10 mg total) by mouth daily. 90 tablet 3   blood glucose meter kit and supplies KIT Dispense based on patient and insurance preference. Use up to four times daily as directed. (FOR E11.9). 1 each 0   Blood Glucose Monitoring Suppl DEVI UAD to check sugars.  E11.9   May substitute to any manufacturer covered by patient's insurance. 1 each 0   Dulaglutide  (TRULICITY ) 4.5 MG/0.5ML SOAJ Inject 4.5 mg as directed once a week. 6 mL 3   empagliflozin  (JARDIANCE ) 25 MG TABS tablet Take 1 tablet (25 mg total) by mouth daily before breakfast. 90 tablet 3   Glucose Blood (BLOOD GLUCOSE TEST STRIPS) STRP UAD to check sugars daily and prn.  E11.9   May substitute to any manufacturer covered by patient's insurance. 100 strip 5   Lancets Misc. MISC UAD to check sugars daily and prn.  E11.9   May substitute to any manufacturer covered by patient's insurance. 100 each 8   metFORMIN  (GLUCOPHAGE ) 500 MG tablet TAKE 1 TABLET(500 MG) BY MOUTH TWICE DAILY WITH A MEAL 180 tablet 2   triamcinolone  (KENALOG ) 0.025 % ointment Apply 1 Application topically 2 (two) times daily as needed. 30 g 1   No current facility-administered medications on file prior to visit.     Review of Systems     Objective:  There were no vitals filed for this visit. BP Readings from Last 3 Encounters:  04/13/24 108/74  11/28/23 108/72  05/23/23 112/70   Wt Readings from Last 3 Encounters:  04/13/24 117 lb (53.1 kg)  11/28/23 114 lb (51.7 kg)  05/23/23 125 lb (56.7 kg)   There is no height or weight on file to calculate BMI.    Physical  Exam     Lab Results  Component Value Date   WBC 9.5 04/13/2024   HGB 9.6 (L) 04/13/2024   HCT 31.2 (L) 04/13/2024   PLT 463.0 (H) 04/13/2024   GLUCOSE 188 (H) 04/13/2024   CHOL 118 04/13/2024   TRIG 88.0 04/13/2024   HDL 52.20 04/13/2024   LDLDIRECT 120.0 09/20/2022   LDLCALC 48 04/13/2024   ALT 13 04/13/2024   AST 13 04/13/2024   NA 138 04/13/2024   K 3.9 04/13/2024   CL 104 04/13/2024   CREATININE 0.56 04/13/2024   BUN 15 04/13/2024   CO2 26 04/13/2024   TSH 1.42 01/23/2016   HGBA1C 8.7 (H) 04/13/2024   MICROALBUR 6.4 (H) 04/13/2024     Assessment & Plan:    See Problem List for Assessment and Plan of chronic medical problems.

## 2024-08-15 NOTE — Patient Instructions (Addendum)
      Blood work was ordered.       Medications changes include :   None    A referral was ordered and someone will call you to schedule an appointment.     Return in about 3 months (around 11/14/2024) for follow up.

## 2024-08-16 ENCOUNTER — Ambulatory Visit: Admitting: Internal Medicine

## 2024-08-16 VITALS — BP 112/78 | HR 93 | Temp 98.1°F | Ht 62.0 in | Wt 112.0 lb

## 2024-08-16 DIAGNOSIS — L299 Pruritus, unspecified: Secondary | ICD-10-CM | POA: Insufficient documentation

## 2024-08-16 DIAGNOSIS — E1169 Type 2 diabetes mellitus with other specified complication: Secondary | ICD-10-CM | POA: Diagnosis not present

## 2024-08-16 DIAGNOSIS — R809 Proteinuria, unspecified: Secondary | ICD-10-CM | POA: Diagnosis not present

## 2024-08-16 DIAGNOSIS — D649 Anemia, unspecified: Secondary | ICD-10-CM | POA: Diagnosis not present

## 2024-08-16 DIAGNOSIS — Z7985 Long-term (current) use of injectable non-insulin antidiabetic drugs: Secondary | ICD-10-CM

## 2024-08-16 DIAGNOSIS — E785 Hyperlipidemia, unspecified: Secondary | ICD-10-CM | POA: Diagnosis not present

## 2024-08-16 DIAGNOSIS — Z7984 Long term (current) use of oral hypoglycemic drugs: Secondary | ICD-10-CM

## 2024-08-16 LAB — CBC WITH DIFFERENTIAL/PLATELET
Basophils Absolute: 0.1 K/uL (ref 0.0–0.1)
Basophils Relative: 0.8 % (ref 0.0–3.0)
Eosinophils Absolute: 0.1 K/uL (ref 0.0–0.7)
Eosinophils Relative: 0.8 % (ref 0.0–5.0)
HCT: 32.8 % — ABNORMAL LOW (ref 36.0–46.0)
Hemoglobin: 10.1 g/dL — ABNORMAL LOW (ref 12.0–15.0)
Lymphocytes Relative: 34.1 % (ref 12.0–46.0)
Lymphs Abs: 3.3 K/uL (ref 0.7–4.0)
MCHC: 30.7 g/dL (ref 30.0–36.0)
MCV: 63.7 fl — ABNORMAL LOW (ref 78.0–100.0)
Monocytes Absolute: 0.5 K/uL (ref 0.1–1.0)
Monocytes Relative: 5.2 % (ref 3.0–12.0)
Neutro Abs: 5.8 K/uL (ref 1.4–7.7)
Neutrophils Relative %: 59.1 % (ref 43.0–77.0)
Platelets: 473 K/uL — ABNORMAL HIGH (ref 150.0–400.0)
RBC: 5.15 Mil/uL — ABNORMAL HIGH (ref 3.87–5.11)
RDW: 19 % — ABNORMAL HIGH (ref 11.5–15.5)
WBC: 9.7 K/uL (ref 4.0–10.5)

## 2024-08-16 LAB — LIPID PANEL
Cholesterol: 135 mg/dL (ref 0–200)
HDL: 57.1 mg/dL (ref >39.00)
LDL Cholesterol: 55 mg/dL (ref 0–99)
NonHDL: 78.05
Total CHOL/HDL Ratio: 2
Triglycerides: 114 mg/dL (ref 0.0–149.0)
VLDL: 22.8 mg/dL (ref 0.0–40.0)

## 2024-08-16 LAB — COMPREHENSIVE METABOLIC PANEL WITH GFR
ALT: 15 U/L (ref 0–35)
AST: 14 U/L (ref 0–37)
Albumin: 4.6 g/dL (ref 3.5–5.2)
Alkaline Phosphatase: 58 U/L (ref 39–117)
BUN: 18 mg/dL (ref 6–23)
CO2: 27 meq/L (ref 19–32)
Calcium: 9.9 mg/dL (ref 8.4–10.5)
Chloride: 104 meq/L (ref 96–112)
Creatinine, Ser: 0.57 mg/dL (ref 0.40–1.20)
GFR: 110.74 mL/min (ref >60.00)
Glucose, Bld: 137 mg/dL — ABNORMAL HIGH (ref 70–99)
Potassium: 4.1 meq/L (ref 3.5–5.1)
Sodium: 139 meq/L (ref 135–145)
Total Bilirubin: 0.4 mg/dL (ref 0.2–1.2)
Total Protein: 8.4 g/dL — ABNORMAL HIGH (ref 6.0–8.3)

## 2024-08-16 LAB — IBC PANEL
Iron: 14 ug/dL — ABNORMAL LOW (ref 42–145)
Saturation Ratios: 2.5 % — ABNORMAL LOW (ref 20.0–50.0)
TIBC: 564.2 ug/dL — ABNORMAL HIGH (ref 250.0–450.0)
Transferrin: 403 mg/dL — ABNORMAL HIGH (ref 212.0–360.0)

## 2024-08-16 LAB — MICROALBUMIN / CREATININE URINE RATIO
Creatinine,U: 47.7 mg/dL
Microalb Creat Ratio: 86.1 mg/g — ABNORMAL HIGH (ref 0.0–30.0)
Microalb, Ur: 4.1 mg/dL — ABNORMAL HIGH (ref 0.0–1.9)

## 2024-08-16 LAB — FERRITIN: Ferritin: 2.2 ng/mL — ABNORMAL LOW (ref 10.0–291.0)

## 2024-08-16 LAB — HEMOGLOBIN A1C: Hgb A1c MFr Bld: 8.1 % — ABNORMAL HIGH (ref 4.6–6.5)

## 2024-08-16 MED ORDER — ATORVASTATIN CALCIUM 10 MG PO TABS: 10.0000 mg | ORAL_TABLET | Freq: Every day | ORAL | 3 refills | Status: AC

## 2024-08-16 MED ORDER — EMPAGLIFLOZIN 25 MG PO TABS: 25.0000 mg | ORAL_TABLET | Freq: Every day | ORAL | 3 refills | Status: AC

## 2024-08-16 MED ORDER — METFORMIN HCL 500 MG PO TABS: ORAL_TABLET | ORAL | 2 refills | Status: DC

## 2024-08-16 MED ORDER — TRULICITY 4.5 MG/0.5ML ~~LOC~~ SOAJ: 4.5000 mg | SUBCUTANEOUS | 1 refills | Status: AC

## 2024-08-16 NOTE — Assessment & Plan Note (Signed)
 Chronic Secondary to diabetes LDL has been controlled Lab Results  Component Value Date   LDLCALC 48 04/13/2024   Check lipids, cmp Continue atorvastatin  10 mg daily

## 2024-08-16 NOTE — Assessment & Plan Note (Addendum)
 Chronic Associated with hyperlipidemia Uncontrolled Lab Results  Component Value Date   HGBA1C 8.7 (H) 04/13/2024   Check A1c, urine albumin/cr ratio Continue Trulicity  4.5 mg weekly, Jardiance  25 mg daily and metformin  500 mg twice daily Not currently checking her sugars and does not want to check sugars Low sugar/carbohydrate diet Eye exams not up-to-date-encouraged eye exam, but she states she does not want to go-she can see fine

## 2024-08-16 NOTE — Assessment & Plan Note (Signed)
 New Itching across upper back for 2 weeks No evidence of rash, erythema, lesions No significantly dry skin ?  Nerve related Can try over-the-counter cream specifically for itchy skin Encouraged her not to itch

## 2024-08-16 NOTE — Assessment & Plan Note (Signed)
 Chronic Working on getting sugars better controlled Will recheck urine albumin/cr ratio today

## 2024-08-21 ENCOUNTER — Ambulatory Visit: Payer: Self-pay | Admitting: Internal Medicine

## 2024-08-21 MED ORDER — METFORMIN HCL ER 750 MG PO TB24: 750.0000 mg | ORAL_TABLET | Freq: Every day | ORAL | 2 refills | Status: AC

## 2024-08-21 NOTE — Progress Notes (Signed)
 Please call daughter-number and blue tab.  Iron levels are low and she has anemia.  Would recommend taking a slow release iron pill daily or at least 3 times a week.  Cholesterol is very good.  Kidney function liver tests are normal.  A1c is 8.1%-sugar slightly better controlled, but still not ideally controlled.  Lets try adjusting her metformin .  I do not think she is tolerated a higher dose but lets change it to an extended release pill which is typically better tolerated and increase the dose slightly.  New prescription sent for metformin  XR 850 mg twice daily-prescription sent to pharmacy. [Stop other metformin ]

## 2024-08-27 ENCOUNTER — Telehealth: Payer: Self-pay

## 2024-08-27 NOTE — Telephone Encounter (Signed)
Spoke with patient's daughter today and info given.

## 2024-08-27 NOTE — Telephone Encounter (Signed)
 Copied from CRM #[id number]. Topic: Clinical - Prescription Issue >> Aug 27, 2024  1:02 PM Montie POUR wrote: Reason for CRM:  Almarie picked up the 750 MG and the 500 MG and she wants to know which one to throw out. This is for Ms. Worden metFORMIN  (GLUCOPHAGE -XR) 750 MG 24 hr tablet. Please call Almarie, daughter, at [phone]

## 2024-12-15 ENCOUNTER — Ambulatory Visit: Admitting: Internal Medicine
